# Patient Record
Sex: Male | Born: 1965 | Race: Black or African American | Hispanic: No | Marital: Single
Health system: Southern US, Community
[De-identification: ages and names within clinical notes are randomized; demographics above are authoritative.]

## PROBLEM LIST (undated history)

## (undated) DIAGNOSIS — K5900 Constipation, unspecified: Secondary | ICD-10-CM

## (undated) DIAGNOSIS — E079 Disorder of thyroid, unspecified: Secondary | ICD-10-CM

## (undated) DIAGNOSIS — F29 Unspecified psychosis not due to a substance or known physiological condition: Secondary | ICD-10-CM

## (undated) DIAGNOSIS — F329 Major depressive disorder, single episode, unspecified: Secondary | ICD-10-CM

## (undated) DIAGNOSIS — F809 Developmental disorder of speech and language, unspecified: Secondary | ICD-10-CM

## (undated) DIAGNOSIS — F209 Schizophrenia, unspecified: Secondary | ICD-10-CM

## (undated) DIAGNOSIS — G47419 Narcolepsy without cataplexy: Secondary | ICD-10-CM

## (undated) DIAGNOSIS — F79 Unspecified intellectual disabilities: Secondary | ICD-10-CM

---

## 2005-12-05 ENCOUNTER — Inpatient Hospital Stay (HOSPITAL_COMMUNITY): Admission: AC | Admit: 2005-12-05 | Discharge: 2005-12-20 | Payer: Self-pay
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
## 2007-07-26 ENCOUNTER — Emergency Department (HOSPITAL_COMMUNITY): Admission: EM | Admit: 2007-07-26 | Discharge: 2007-07-26 | Payer: Self-pay | Admitting: *Deleted

## 2007-12-14 ENCOUNTER — Encounter: Admission: RE | Admit: 2007-12-14 | Discharge: 2007-12-14 | Payer: Self-pay | Admitting: Family Medicine

## 2008-06-10 ENCOUNTER — Emergency Department (HOSPITAL_COMMUNITY): Admission: EM | Admit: 2008-06-10 | Discharge: 2008-06-10 | Payer: Self-pay | Admitting: Emergency Medicine

## 2008-06-25 ENCOUNTER — Emergency Department (HOSPITAL_COMMUNITY): Admission: EM | Admit: 2008-06-25 | Discharge: 2008-06-25 | Payer: Self-pay | Admitting: Emergency Medicine

## 2008-12-19 ENCOUNTER — Inpatient Hospital Stay (HOSPITAL_COMMUNITY): Admission: RE | Admit: 2008-12-19 | Discharge: 2008-12-25 | Payer: Self-pay | Admitting: Surgery

## 2009-01-24 ENCOUNTER — Emergency Department (HOSPITAL_COMMUNITY): Admission: EM | Admit: 2009-01-24 | Discharge: 2009-01-24 | Payer: Self-pay | Admitting: Emergency Medicine

## 2010-12-13 ENCOUNTER — Encounter: Payer: Self-pay | Admitting: Family Medicine

## 2011-03-04 LAB — CBC
Hemoglobin: 13.3 g/dL (ref 13.0–17.0)
MCHC: 33.8 g/dL (ref 30.0–36.0)
MCV: 87.3 fL (ref 78.0–100.0)
Platelets: 277 10*3/uL (ref 150–400)

## 2011-03-04 LAB — COMPREHENSIVE METABOLIC PANEL
ALT: 36 U/L (ref 0–53)
AST: 23 U/L (ref 0–37)
BUN: 8 mg/dL (ref 6–23)
Glucose, Bld: 87 mg/dL (ref 70–99)
Potassium: 4.1 mEq/L (ref 3.5–5.1)
Sodium: 139 mEq/L (ref 135–145)
Total Bilirubin: 0.6 mg/dL (ref 0.3–1.2)
Total Protein: 6.9 g/dL (ref 6.0–8.3)

## 2011-03-04 LAB — DIFFERENTIAL
Basophils Relative: 1 % (ref 0–1)
Eosinophils Absolute: 0.6 10*3/uL (ref 0.0–0.7)
Lymphocytes Relative: 23 % (ref 12–46)
Monocytes Absolute: 1 10*3/uL (ref 0.1–1.0)
Monocytes Relative: 9 % (ref 3–12)
Neutrophils Relative %: 63 % (ref 43–77)

## 2011-03-08 LAB — CBC
HCT: 41.5 % (ref 39.0–52.0)
Hemoglobin: 13.7 g/dL (ref 13.0–17.0)
Hemoglobin: 15.6 g/dL (ref 13.0–17.0)
MCHC: 33.3 g/dL (ref 30.0–36.0)
MCV: 89.6 fL (ref 78.0–100.0)
MCV: 89.9 fL (ref 78.0–100.0)
Platelets: 203 10*3/uL (ref 150–400)
Platelets: 233 10*3/uL (ref 150–400)
RDW: 13.7 % (ref 11.5–15.5)

## 2011-03-08 LAB — GLUCOSE, CAPILLARY

## 2011-03-08 LAB — URINALYSIS, ROUTINE W REFLEX MICROSCOPIC
Nitrite: NEGATIVE
Specific Gravity, Urine: 1.026 (ref 1.005–1.030)
Urobilinogen, UA: 1 mg/dL (ref 0.0–1.0)
pH: 6.5 (ref 5.0–8.0)

## 2011-03-08 LAB — BASIC METABOLIC PANEL
BUN: 8 mg/dL (ref 6–23)
CO2: 29 mEq/L (ref 19–32)
Calcium: 9.9 mg/dL (ref 8.4–10.5)
Creatinine, Ser: 1.06 mg/dL (ref 0.4–1.5)
Creatinine, Ser: 1.21 mg/dL (ref 0.4–1.5)
GFR calc Af Amer: >60 mL/min (ref >60)
GFR calc Af Amer: >60 mL/min (ref >60)
GFR calc non Af Amer: >60 mL/min (ref >60)
GFR calc non Af Amer: >60 mL/min (ref >60)
Sodium: 133 mEq/L — ABNORMAL LOW (ref 135–145)

## 2011-03-08 LAB — PROTIME-INR
INR: 1 (ref 0.00–1.49)
Prothrombin Time: 13.3 seconds (ref 11.6–15.2)

## 2011-03-08 LAB — DIFFERENTIAL
Eosinophils Absolute: 0.3 10*3/uL (ref 0.0–0.7)
Eosinophils Relative: 3 % (ref 0–5)
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
## 2011-09-03 LAB — CBC
MCHC: 34.5
RDW: 14.5 — ABNORMAL HIGH
WBC: 11 — ABNORMAL HIGH

## 2011-09-03 LAB — D-DIMER, QUANTITATIVE: D-Dimer, Quant: 0.34

## 2011-09-03 LAB — DIFFERENTIAL
Basophils Absolute: 0.1
Basophils Relative: 1
Eosinophils Absolute: 0.7
Lymphocytes Relative: 31
Lymphs Abs: 3.4 — ABNORMAL HIGH
Monocytes Relative: 10
Neutro Abs: 5.8
Neutrophils Relative %: 52

## 2011-09-03 LAB — I-STAT 8, (EC8 V) (CONVERTED LAB)
Chloride: 106
Glucose, Bld: 90
Potassium: 3.8
TCO2: 28
pH, Ven: 7.369 — ABNORMAL HIGH

## 2011-09-03 LAB — TRICYCLICS SCREEN, URINE: TCA Scrn: NOT DETECTED

## 2011-09-03 LAB — POCT CARDIAC MARKERS
CKMB, poc: <1 — ABNORMAL LOW
Myoglobin, poc: 84.9

## 2011-09-03 LAB — BASIC METABOLIC PANEL
BUN: 11
Calcium: 9
Creatinine, Ser: 1.14
Sodium: 136

## 2011-09-03 LAB — RAPID URINE DRUG SCREEN, HOSP PERFORMED: Tetrahydrocannabinol: NOT DETECTED

## 2015-06-01 ENCOUNTER — Emergency Department (HOSPITAL_COMMUNITY)
Admission: EM | Admit: 2015-06-01 | Discharge: 2015-06-02 | Disposition: A | Discharge status: Home / Self Care | Payer: Medicare Other | Attending: Emergency Medicine | Admitting: Emergency Medicine

## 2015-06-01 DIAGNOSIS — X58XXXA Exposure to other specified factors, initial encounter: Secondary | ICD-10-CM | POA: Insufficient documentation

## 2015-06-01 DIAGNOSIS — T450X1A Poisoning by antiallergic and antiemetic drugs, accidental (unintentional), initial encounter: Secondary | ICD-10-CM | POA: Diagnosis not present

## 2015-06-01 DIAGNOSIS — Z7952 Long term (current) use of systemic steroids: Secondary | ICD-10-CM | POA: Diagnosis not present

## 2015-06-01 DIAGNOSIS — Y9289 Other specified places as the place of occurrence of the external cause: Secondary | ICD-10-CM | POA: Insufficient documentation

## 2015-06-01 DIAGNOSIS — Y998 Other external cause status: Secondary | ICD-10-CM | POA: Diagnosis not present

## 2015-06-01 DIAGNOSIS — Z79899 Other long term (current) drug therapy: Secondary | ICD-10-CM | POA: Insufficient documentation

## 2015-06-01 DIAGNOSIS — T43591A Poisoning by other antipsychotics and neuroleptics, accidental (unintentional), initial encounter: Secondary | ICD-10-CM | POA: Insufficient documentation

## 2015-06-01 DIAGNOSIS — R5383 Other fatigue: Secondary | ICD-10-CM | POA: Diagnosis present

## 2015-06-01 DIAGNOSIS — T50991A Poisoning by other drugs, medicaments and biological substances, accidental (unintentional), initial encounter: Secondary | ICD-10-CM | POA: Insufficient documentation

## 2015-06-01 DIAGNOSIS — T50901A Poisoning by unspecified drugs, medicaments and biological substances, accidental (unintentional), initial encounter: Secondary | ICD-10-CM

## 2015-06-01 DIAGNOSIS — Y9389 Activity, other specified: Secondary | ICD-10-CM | POA: Diagnosis not present

## 2015-06-01 NOTE — ED Notes (Addendum)
According to EMS, Pt was administered incorrect medications at 19:30 on 7/10 (1000mg  Fish Oil, 500mg  Colzaril, 180mg  Alegra). [facility] notified Poison Control and were initially advised to primarily monitor pt and notify EMS if pt became excessively lethargic or symptomatic. [facility] alert and oriented to place.

## 2015-06-02 ENCOUNTER — Encounter (HOSPITAL_COMMUNITY): Payer: Self-pay | Admitting: Emergency Medicine

## 2015-06-02 NOTE — ED Notes (Signed)
Patient ambulated without any assistance.

## 2015-06-02 NOTE — Discharge Instructions (Signed)
DO NOT GIVE ANY SEDATING MEDICATIONS UNTIL THE PATIENT'S SOMNOLENCE IS COMPLETELY RESOLVED.

## 2015-06-02 NOTE — ED Provider Notes (Signed)
CSN: [id number]     Arrival date & time 06/01/15  2354 History   First MD Initiated Contact with Patient 06/02/15 0029     Chief Complaint  Patient presents with  . Drug Overdose     (Consider location/radiation/quality/duration/timing/severity/associated sxs/prior Treatment) Patient is a 49 y.o. male presenting with Overdose. The history is provided by the patient, the nursing home and the EMS personnel. No language interpreter was used.  Drug Overdose This is a new problem. The current episode started today. Associated symptoms include fatigue. Pertinent negatives include no chills or fever. Associated symptoms comments: The patient is a resident at [facility] and was given the wrong medications earlier tonight. Per EMS report, around 7:30 pm he was given Fish Oil, 500 mg Clozaril and 180 mg Alegra. He was monitored for a short period but developed symptoms of "grogginess" and lethargy and so was transported to the emergency department for further management. He has no complaint of pain, nausea, LOC.   Marland Kitchen    History reviewed. No pertinent past medical history. No past surgical history on file. History reviewed. No pertinent family history. History  Substance Use Topics  . Smoking status: Not on file  . Smokeless tobacco: Not on file  . Alcohol Use: Not on file    Review of Systems  Constitutional: Positive for fatigue. Negative for fever and chills.  HENT: Negative.   Respiratory: Negative.   Cardiovascular: Negative.   Gastrointestinal: Negative.   Musculoskeletal: Negative.   Skin: Negative.   Neurological: Negative.       Allergies  Review of patient's allergies indicates no known allergies.  Home Medications   Prior to Admission medications   Medication Sig Start Date End Date Taking? Authorizing Provider  benztropine (COGENTIN) 0.5 MG tablet Take 0.5 mg by mouth daily.   Yes Historical Provider, MD  chlorproMAZINE (THORAZINE) 100 MG tablet Take  100 mg by mouth at bedtime.   Yes Historical Provider, MD  clonazePAM (KLONOPIN) 0.5 MG tablet Take 0.5 mg by mouth at bedtime.   Yes Historical Provider, MD  clonazePAM (KLONOPIN) 1 MG tablet Take 0.5 mg by mouth daily as needed for anxiety.   Yes Historical Provider, MD  docusate sodium (COLACE) 100 MG capsule Take 100 mg by mouth 2 (two) times daily.   Yes Historical Provider, MD  fluPHENAZine (PROLIXIN) 10 MG tablet Take 10 mg by mouth 3 (three) times daily.   Yes Historical Provider, MD  fluticasone (FLONASE) 50 MCG/ACT nasal spray Place 1 spray into both nostrils daily.   Yes Historical Provider, MD  gemfibrozil (LOPID) 600 MG tablet Take 600 mg by mouth daily.   Yes Historical Provider, MD  hydrocortisone 2.5 % cream Apply 1 application topically 2 (two) times daily.   Yes Historical Provider, MD  levothyroxine (SYNTHROID, LEVOTHROID) 25 MCG tablet Take 12.5 mcg by mouth daily before breakfast.   Yes Historical Provider, MD  lurasidone (LATUDA) 80 MG TABS tablet Take 80 mg by mouth daily with breakfast.   Yes Historical Provider, MD  metoprolol tartrate (LOPRESSOR) 25 MG tablet Take 25 mg by mouth 2 (two) times daily.   Yes Historical Provider, MD  Multiple Vitamin (MULTIVITAMIN WITH MINERALS) TABS tablet Take 1 tablet by mouth daily.   Yes Historical Provider, MD  omeprazole (PRILOSEC) 20 MG capsule Take 20 mg by mouth daily.   Yes Historical Provider, MD  sertraline (ZOLOFT) 50 MG tablet Take 50 mg by mouth daily.   Yes Historical Provider, MD  simvastatin (ZOCOR) 5 MG tablet Take 5 mg by mouth at bedtime.   Yes Historical Provider, MD  tamsulosin (FLOMAX) 0.4 MG CAPS capsule Take 0.4 mg by mouth 2 (two) times daily.   Yes Historical Provider, MD  traMADol (ULTRAM) 50 MG tablet Take 50 mg by mouth 2 (two) times daily.   Yes Historical Provider, MD   BP 139/80 mmHg  Pulse 97  Temp(Src) 98 F (36.7 C) (Oral)  Resp 17  SpO2 96% Physical Exam  Constitutional: He is oriented to person,  place, and time. He appears well-developed and well-nourished.  He is awake, mildly slurred speech, appears tired.   HENT:  Head: Normocephalic.  Neck: Normal range of motion. Neck supple.  Cardiovascular: Normal rate and regular rhythm.   Pulmonary/Chest: Effort normal and breath sounds normal.  Abdominal: Soft. Bowel sounds are normal. There is no tenderness. There is no rebound and no guarding.  Musculoskeletal: Normal range of motion.  Neurological: He is alert and oriented to person, place, and time.  Skin: Skin is warm and dry. No rash noted.  Psychiatric: He has a normal mood and affect.    ED Course  Procedures (including critical care time) Labs Review Labs Reviewed - No data to display  Imaging Review No results found.   EKG Interpretation None      MDM   Final diagnoses:  None    1. Accidental overdose  The patient's medication MAR reviewed. He is not prescribed Clozaril at all. He has been sleepy here but easily waken and remains oriented.   The patient is observed for several hours without deterioration in level of consciousness. He has been able to sleep without difficulty. He has eaten and has been drinking. He is ambulatory and steady. He is stable for discharge back to his resident facility.    Elpidio AnisShari Addley Ballinger, PA-C 06/02/15 [id number]  Derwood KaplanAnkit Nanavati, MD 06/02/15 [phone]

## 2017-05-11 ENCOUNTER — Encounter (HOSPITAL_COMMUNITY): Payer: Self-pay

## 2017-05-11 ENCOUNTER — Emergency Department (HOSPITAL_COMMUNITY): Payer: Medicare Other

## 2017-05-11 ENCOUNTER — Emergency Department (HOSPITAL_COMMUNITY)
Admission: EM | Admit: 2017-05-11 | Discharge: 2017-05-12 | Disposition: A | Discharge status: Home / Self Care | Payer: Medicare Other | Attending: Emergency Medicine | Admitting: Emergency Medicine

## 2017-05-11 DIAGNOSIS — F339 Major depressive disorder, recurrent, unspecified: Secondary | ICD-10-CM | POA: Insufficient documentation

## 2017-05-11 DIAGNOSIS — R4182 Altered mental status, unspecified: Secondary | ICD-10-CM | POA: Diagnosis present

## 2017-05-11 DIAGNOSIS — F209 Schizophrenia, unspecified: Secondary | ICD-10-CM | POA: Diagnosis not present

## 2017-05-11 DIAGNOSIS — F918 Other conduct disorders: Secondary | ICD-10-CM | POA: Insufficient documentation

## 2017-05-11 DIAGNOSIS — F200 Paranoid schizophrenia: Secondary | ICD-10-CM | POA: Diagnosis present

## 2017-05-11 DIAGNOSIS — Z79899 Other long term (current) drug therapy: Secondary | ICD-10-CM | POA: Insufficient documentation

## 2017-05-11 DIAGNOSIS — F201 Disorganized schizophrenia: Secondary | ICD-10-CM | POA: Diagnosis not present

## 2017-05-11 DIAGNOSIS — R4689 Other symptoms and signs involving appearance and behavior: Secondary | ICD-10-CM

## 2017-05-11 HISTORY — DX: Unspecified psychosis not due to a substance or known physiological condition: F29

## 2017-05-11 HISTORY — DX: Major depressive disorder, single episode, unspecified: F32.9

## 2017-05-11 HISTORY — DX: Narcolepsy without cataplexy: G47.419

## 2017-05-11 HISTORY — DX: Schizophrenia, unspecified: F20.9

## 2017-05-11 HISTORY — DX: Developmental disorder of speech and language, unspecified: F80.9

## 2017-05-11 HISTORY — DX: Disorder of thyroid, unspecified: E07.9

## 2017-05-11 HISTORY — DX: Constipation, unspecified: K59.00

## 2017-05-11 HISTORY — DX: Unspecified intellectual disabilities: F79

## 2017-05-11 LAB — CBC WITH DIFFERENTIAL/PLATELET
BASOS PCT: 0 %
Basophils Absolute: 0 10*3/uL (ref 0.0–0.1)
Eosinophils Absolute: 0.2 10*3/uL (ref 0.0–0.7)
Eosinophils Relative: 2 %
HCT: 40.9 % (ref 39.0–52.0)
Hemoglobin: 13.8 g/dL (ref 13.0–17.0)
Lymphocytes Relative: 27 %
Lymphs Abs: 3 10*3/uL (ref 0.7–4.0)
MCH: 29.9 pg (ref 26.0–34.0)
MCHC: 33.7 g/dL (ref 30.0–36.0)
MCV: 88.7 fL (ref 78.0–100.0)
MONO ABS: 0.7 10*3/uL (ref 0.1–1.0)
MONOS PCT: 7 %
NEUTROS ABS: 7.2 10*3/uL (ref 1.7–7.7)
Neutrophils Relative %: 64 %
Platelets: 307 10*3/uL (ref 150–400)
RBC: 4.61 MIL/uL (ref 4.22–5.81)
RDW: 14.6 % (ref 11.5–15.5)
WBC: 11.1 10*3/uL — ABNORMAL HIGH (ref 4.0–10.5)

## 2017-05-11 LAB — RAPID URINE DRUG SCREEN, HOSP PERFORMED
Amphetamines: NOT DETECTED
BARBITURATES: NOT DETECTED
Benzodiazepines: NOT DETECTED
Cocaine: NOT DETECTED
OPIATES: NOT DETECTED
TETRAHYDROCANNABINOL: NOT DETECTED

## 2017-05-11 LAB — COMPREHENSIVE METABOLIC PANEL
ALT: 28 U/L (ref 17–63)
ANION GAP: 13 (ref 5–15)
AST: 49 U/L — ABNORMAL HIGH (ref 15–41)
Albumin: 4.3 g/dL (ref 3.5–5.0)
Alkaline Phosphatase: 74 U/L (ref 38–126)
BUN: 18 mg/dL (ref 6–20)
CHLORIDE: 106 mmol/L (ref 101–111)
CO2: 23 mmol/L (ref 22–32)
Calcium: 9.7 mg/dL (ref 8.9–10.3)
Creatinine, Ser: 1.31 mg/dL — ABNORMAL HIGH (ref 0.61–1.24)
GFR calc non Af Amer: >60 mL/min (ref >60)
Glucose, Bld: 80 mg/dL (ref 65–99)
POTASSIUM: 3.8 mmol/L (ref 3.5–5.1)
SODIUM: 142 mmol/L (ref 135–145)
Total Bilirubin: 1 mg/dL (ref 0.3–1.2)
Total Protein: 8.1 g/dL (ref 6.5–8.1)

## 2017-05-11 LAB — ETHANOL: Alcohol, Ethyl (B): <5 mg/dL (ref <5)

## 2017-05-11 LAB — ACETAMINOPHEN LEVEL

## 2017-05-11 LAB — SALICYLATE LEVEL

## 2017-05-11 MED ORDER — ADULT MULTIVITAMIN W/MINERALS CH: 1.0000 | ORAL_TABLET | Freq: Every day | ORAL | Status: DC

## 2017-05-11 MED ORDER — FLUPHENAZINE HCL 5 MG PO TABS
10.0000 mg | ORAL_TABLET | Freq: Three times a day (TID) | ORAL | Status: DC
Start: 2017-05-11 — End: 2017-05-12
  Filled 2017-05-11 (×4): qty 2

## 2017-05-11 MED ORDER — PANTOPRAZOLE SODIUM 40 MG PO TBEC: 40.0000 mg | DELAYED_RELEASE_TABLET | Freq: Every day | ORAL | Status: DC

## 2017-05-11 MED ORDER — VENLAFAXINE HCL ER 75 MG PO CP24: 150.0000 mg | ORAL_CAPSULE | Freq: Every day | ORAL | Status: DC

## 2017-05-11 MED ORDER — LEVOTHYROXINE SODIUM 25 MCG PO TABS
12.5000 ug | ORAL_TABLET | Freq: Every day | ORAL | Status: DC
  Filled 2017-05-11: qty 0.5

## 2017-05-11 MED ORDER — ACETAMINOPHEN 500 MG PO TABS: 500.0000 mg | ORAL_TABLET | Freq: Three times a day (TID) | ORAL | Status: DC | PRN

## 2017-05-11 MED ORDER — BENZTROPINE MESYLATE 0.5 MG PO TABS
0.5000 mg | ORAL_TABLET | Freq: Every day | ORAL | Status: DC
Start: 2017-05-11 — End: 2017-05-12

## 2017-05-11 MED ORDER — MELOXICAM 7.5 MG PO TABS
7.5000 mg | ORAL_TABLET | Freq: Every day | ORAL | Status: DC
  Filled 2017-05-11 (×2): qty 1

## 2017-05-11 MED ORDER — CLONAZEPAM 0.5 MG PO TABS
0.5000 mg | ORAL_TABLET | Freq: Every day | ORAL | Status: DC
  Administered 2017-05-11: 0.5 mg via ORAL
  Filled 2017-05-11: qty 1

## 2017-05-11 MED ORDER — CHLORPROMAZINE HCL 25 MG PO TABS
100.0000 mg | ORAL_TABLET | Freq: Every day | ORAL | Status: DC
  Administered 2017-05-11: 100 mg via ORAL
  Filled 2017-05-11: qty 4

## 2017-05-11 MED ORDER — TRAMADOL HCL 50 MG PO TABS
50.0000 mg | ORAL_TABLET | Freq: Two times a day (BID) | ORAL | Status: DC
  Filled 2017-05-11: qty 1

## 2017-05-11 MED ORDER — GEMFIBROZIL 600 MG PO TABS
600.0000 mg | ORAL_TABLET | Freq: Every day | ORAL | Status: DC
  Filled 2017-05-11: qty 1

## 2017-05-11 MED ORDER — DOCUSATE SODIUM 100 MG PO CAPS
100.0000 mg | ORAL_CAPSULE | Freq: Two times a day (BID) | ORAL | Status: DC
  Administered 2017-05-11: 100 mg via ORAL
  Filled 2017-05-11: qty 1

## 2017-05-11 MED ORDER — TAMSULOSIN HCL 0.4 MG PO CAPS
0.4000 mg | ORAL_CAPSULE | Freq: Two times a day (BID) | ORAL | Status: DC
  Administered 2017-05-11: 0.4 mg via ORAL
  Filled 2017-05-11: qty 1

## 2017-05-11 MED ORDER — FLUTICASONE PROPIONATE 50 MCG/ACT NA SUSP
1.0000 | Freq: Every day | NASAL | Status: DC
  Filled 2017-05-11: qty 16

## 2017-05-11 MED ORDER — METOPROLOL TARTRATE 25 MG PO TABS
25.0000 mg | ORAL_TABLET | Freq: Two times a day (BID) | ORAL | Status: DC
  Administered 2017-05-11: 25 mg via ORAL
  Filled 2017-05-11: qty 1

## 2017-05-11 NOTE — BH Assessment (Signed)
Tele Assessment Note   [name] is an 51 y.o. male.  -Clinician discussed patient with Russell Sites, PA.  Patient is a 51 year old male with history of intellectual disability, schizophrenia, thyroid disease, narcolepsy, psychosis, depressive disorder, presenting [facility] the ED from [facility] for disruptive behavior. Staff members at facility states patient is usually talkative and very friendly, but today he was yelling at them, seemed somewhat agitated, and then stop talking at all. On arrival here, patient will follow all commands when prompted and will shake his head yes and no but will not provide any verbal responses. He did recently have some medication adjustments, his Latuda was reduced from 120 mg to 80 mg recently but unsure why this occurred.  Clinician attempted to engage patient but patient only looked at clinician and did not speak.  Patient gave fair eye contact but did not sit up to try to reciprocate engagement.  Clinician called [name] at [phone].  Spoke to a [employer] who works 2nd shift there and provided information to complete assessment.  [facility] is a 18 bed assisted living center.  She said that patient has not been acting like himself for the last few days.  She said his appetite has been poor.  He has not been talking much lately and he today had the outburst of yelling.  [name] said that he has been living at [facility] since 2010 she thinks.  She said that he does have med management from [name] with [facility].  Latuda was decreased from 120mg  to 80mg  on June 5.  Patient has been refusing meds for the last few days also.  Patient was at [facility] many years ago.  Outpatient services through [facility] currently.  Patient does use tobacco.  [name] said that the best person to talk to tomorrow would be [name] at their facility.  He is in at 07:00 and can be contacted at [phone]  [phone].  Diagnosis: I/DD severe; MDD, schizophrenia  Past Medical History:  Past Medical History:  Diagnosis Date  . Constipation   . Intellectual disability   . Major depressive disorder   . Mental and behavioral problems with communication (including speech)   . Narcolepsy   . Psychosis   . Schizophrenia (HCC)   . Thyroid disease     No past surgical history on file.  Family History: No family history on file.  Social History:  has no tobacco, alcohol, and drug history on file.  Additional Social History:  Alcohol / Drug Use Pain Medications: Tramadol 50mg  twice times daily Prescriptions: Cogentin & Thorazine too Over the Counter: Tylenol History of alcohol / drug use?: No history of alcohol / drug abuse  CIWA: CIWA-Ar BP: (!) 127/55 Pulse Rate: 95 COWS:    PATIENT STRENGTHS: (choose at least two) Communication skills Physical Health  Allergies: No Known Allergies  Home Medications:  (Not in a hospital admission)  OB/GYN Status:  No LMP for male patient.  General Assessment Data [facility] of Assessment: [facility] TTS Assessment: In system Is this a Tele or Face-to-Face Assessment?: Face-to-Face Is this an Initial Assessment or a Re-assessment for this encounter?: Initial Assessment Marital status: Single Is patient pregnant?: No Pregnancy Status: No Living Arrangements: [name] (18 bed facility) assisted li) Can pt return to current living arrangement?: Yes Admission Status: Voluntary Is patient capable of signing voluntary admission?: No (Unsure.  Pt has severe  I/DD) Referral Source: Other ([facility] referred by [name] Insurance type: MCR/MCD     Crisis Care Plan Living Arrangements: Group [name] (18 bed facility) assisted li) Legal Guardian: Other: (Unsure at this time.) Name of Psychiatrist: Dr. Estrella [facility] with [name] Name of Therapist: [facility]?  Education  Status Is patient currently in school?: No  Risk to self with the past 6 months Suicidal Ideation: No Has patient been a risk to self within the past 6 months prior to admission? : No Suicidal Intent: No Has patient had any suicidal intent within the past 6 months prior to admission? : No Is patient at risk for suicide?: No Suicidal Plan?: No Has patient had any suicidal plan within the past 6 months prior to admission? : No Access to Means: No What has been your use of drugs/alcohol within the last 12 months?: N/A Previous Attempts/Gestures: No How many times?: 0 Other Self Harm Risks: None Triggers for Past Attempts: None known Intentional Self Injurious Behavior: None Family Suicide History: No Recent stressful life event(s): Recent negative physical changes (Medication changes?) Persecutory voices/beliefs?: No Depression: Yes Depression Symptoms: Despondent (Difficult to assess) Substance abuse history and/or treatment for substance abuse?: No Suicide prevention information given to non-admitted patients: Not applicable  Risk to Others within the past 6 months Homicidal Ideation: No Does patient have any lifetime risk of violence toward others beyond the six months prior to admission? : No Thoughts of Harm to Others: No Current Homicidal Intent: No Current Homicidal Plan: No Access to Homicidal Means: No Identified Victim: No one History of harm to others?: No Assessment of Violence: None Noted Violent Behavior Description: No history Does patient have access to weapons?: No Criminal Charges Pending?: No Does patient have a court date: No Is patient on probation?: No  Psychosis Hallucinations: Auditory (Pt has been whispering a lot to himself lately) Delusions: None noted  Mental Status Report Appearance/Hygiene: Unremarkable Eye Contact: Fair Motor Activity: Freedom of movement, Unremarkable Speech: Unable to assess (Pt would not speak.) Level of Consciousness:  Alert Mood: Anxious, Suspicious, Helpless Affect: Apprehensive, Fearful Anxiety Level: Severe Thought Processes: Unable to Assess Judgement: Impaired Orientation: Appropriate for developmental age Obsessive Compulsive Thoughts/Behaviors: Unable to Assess  Cognitive Functioning Concentration: Poor Memory: Recent Impaired, Remote Impaired IQ: Below Average Level of Function: Severe I/DD Insight: Poor Impulse Control: Poor Appetite: Poor Weight Loss:  (Not eating well over last few days.) Weight Gain: 0 Sleep: Unable to Assess Total Hours of Sleep:  (Unsure) Vegetative Symptoms: Staying in bed  ADLScreening [facility] Assessment Services) Patient's cognitive ability adequate to safely complete daily activities?: Yes Patient able to express need for assistance with ADLs?: Yes Independently performs ADLs?: No  Prior Inpatient Therapy Prior Inpatient Therapy: Yes Prior Therapy Dates: Unknown Prior Therapy Facilty/Provider(s): [facility] Reason for Treatment: Unknown  Prior Outpatient Therapy Prior Outpatient Therapy: Yes Prior Therapy Dates: Current Prior Therapy Facilty/Provider(s): [facility] Reason for Treatment: med management & therapy Does patient have an ACCT team?: No Does patient have Intensive In-House Services?  : No Does patient have [facility] services? : No Does patient have P4CC services?: No  ADL Screening (condition at time of admission) Patient's cognitive ability adequate to safely complete daily activities?: Yes Is the patient deaf or have difficulty hearing?: No Does the patient have difficulty seeing, even when wearing glasses/contacts?: Yes (Wears glasses.) Does the patient have difficulty concentrating, remembering, or making decisions?: Yes Patient able to express need for assistance with ADLs?:  Yes Does the patient have difficulty dressing or bathing?: Yes Independently performs ADLs?: No Communication: Needs assistance Is this a change from baseline?:  Change from baseline, expected to last >3 days Dressing (OT): Needs assistance Is this a change from baseline?: Pre-admission baseline (May need some reminders.) Grooming: Needs assistance Is this a change from baseline?: Pre-admission baseline Feeding: Independent Bathing: Needs assistance Is this a change from baseline?: Pre-admission baseline (Usually independent) Toileting: Independent In/Out Bed: Independent Walks in Home: Independent Does the patient have difficulty walking or climbing stairs?: No Weakness of Legs: None Weakness of Arms/Hands: None       Abuse/Neglect Assessment (Assessment to be complete while patient is alone) Physical Abuse: Denies Verbal Abuse: Denies Sexual Abuse: Denies Exploitation of patient/patient's resources: Denies Self-Neglect: Denies     Merchant navy officerAdvance Directives (For Healthcare) Does Patient Have a Medical Advance Directive?: No Would patient like information on creating a medical advance directive?: No - Patient declined    Additional Information 1:1 In Past 12 Months?: No CIRT Risk: No Elopement Risk: No Does patient have medical clearance?: Yes     Disposition:  Disposition Initial Assessment Completed for this Encounter: Yes Disposition of Patient: Other dispositions Other disposition(s): Other (Comment) (Per Russell SievertSpencer Simon, PA pt needs AM psych eval.)  [name], [name] 05/11/2017 10:18 PM

## 2017-05-11 NOTE — ED Provider Notes (Signed)
[facility]-EMERGENCY DEPT Provider Note   CSN: [id number] Arrival date & time: 05/11/17  1545     History   Chief Complaint Chief Complaint  Patient presents with  . Altered Mental Status    HPI [name] is a 51 y.o. male.  The history is provided by the patient and medical records.  Altered Mental Status      51 year old male With history of intellectual disability, schizophrenia, thyroid disease, narcolepsy, psychosis, depressive disorder, presenting to the ED from [facility] for disruptive behavior. Staff members at facility states patient is usually talkative and very friendly, but today he was yelling at them, seemed somewhat agitated, and then stop talking at all. On arrival here, patient will follow all commands when prompted and will shake his head yes and no but will not provide any verbal responses. He did recently have some medication adjustments, his Latuda was reduced from 120 mg to 80 mg recently but unsure why this occurred.  No past medical history on file.  There are no active problems to display for this patient.   No past surgical history on file.     Home Medications    Prior to Admission medications   Medication Sig Start Date End Date Taking? Authorizing Provider  benztropine (COGENTIN) 0.5 MG tablet Take 0.5 mg by mouth daily.    [provider]  chlorproMAZINE (THORAZINE) 100 MG tablet Take 100 mg by mouth at bedtime.    [provider]  clonazePAM (KLONOPIN) 0.5 MG tablet Take 0.5 mg by mouth at bedtime.    [provider]  clonazePAM (KLONOPIN) 1 MG tablet Take 0.5 mg by mouth daily as needed for anxiety.    [provider]  docusate sodium (COLACE) 100 MG capsule Take 100 mg by mouth 2 (two) times daily.    [provider]  fluPHENAZine (PROLIXIN) 10 MG tablet Take 10 mg by mouth 3 (three) times daily.    [provider]  fluticasone (FLONASE) 50 MCG/ACT nasal spray Place 1  spray into both nostrils daily.    [provider]  gemfibrozil (LOPID) 600 MG tablet Take 600 mg by mouth daily.    [provider]  hydrocortisone 2.5 % cream Apply 1 application topically 2 (two) times daily.    [provider]  levothyroxine (SYNTHROID, LEVOTHROID) 25 MCG tablet Take 12.5 mcg by mouth daily before breakfast.    [provider]  lurasidone (LATUDA) 80 MG TABS tablet Take 80 mg by mouth daily with breakfast.    [provider]  metoprolol tartrate (LOPRESSOR) 25 MG tablet Take 25 mg by mouth 2 (two) times daily.    [provider]  Multiple Vitamin (MULTIVITAMIN WITH MINERALS) TABS tablet Take 1 tablet by mouth daily.    [provider]  omeprazole (PRILOSEC) 20 MG capsule Take 20 mg by mouth daily.    [provider]  sertraline (ZOLOFT) 50 MG tablet Take 50 mg by mouth daily.    [provider]  simvastatin (ZOCOR) 5 MG tablet Take 5 mg by mouth at bedtime.    [provider]  tamsulosin (FLOMAX) 0.4 MG CAPS capsule Take 0.4 mg by mouth 2 (two) times daily.    [provider]  traMADol (ULTRAM) 50 MG tablet Take 50 mg by mouth 2 (two) times daily.    [provider]    Family History No family history on file.  Social History Social History  Substance Use Topics  .  Smoking status: Not on file  . Smokeless tobacco: Not on file  . Alcohol use Not on file     Allergies   Patient has no known allergies.   Review of Systems Review of Systems  Psychiatric/Behavioral: Positive for behavioral problems.  All other systems reviewed and are negative.    Physical Exam Updated Vital Signs BP (!) 147/73   Pulse (!) 105   Temp 98.7 F (37.1 C) (Oral)   Resp 16   SpO2 98%   Physical Exam  Constitutional: He appears well-developed and well-nourished.  HENT:  Head: Normocephalic and atraumatic.  Mouth/Throat: Oropharynx is clear and moist.  Eyes:  Conjunctivae and EOM are normal. Pupils are equal, round, and reactive to light.  Pupils are symmetric and reactive bilaterally  Neck: Normal range of motion.  Cardiovascular: Normal rate, regular rhythm and normal heart sounds.   Pulmonary/Chest: Effort normal and breath sounds normal. No respiratory distress. He has no wheezes.  Abdominal: Soft. Bowel sounds are normal. There is no tenderness. There is no rebound.  Musculoskeletal: Normal range of motion.  Neurological: He is alert.  Awake, alert, seemingly oriented, will shake head yes and no to questions but will not speak, he follows all commands when prompted, has normal strength and sensation throughout his upper and lower extremities without noted ataxia, no tremors or seizure activity  Skin: Skin is warm and dry.  Psychiatric: He has a normal mood and affect.  Nursing note and vitals reviewed.    ED Treatments / Results  Labs (all labs ordered are listed, but only abnormal results are displayed) Labs Reviewed  CBC WITH DIFFERENTIAL/PLATELET - Abnormal; Notable for the following:       Result Value   WBC 11.1 (*)    All other components within normal limits  ACETAMINOPHEN LEVEL - Abnormal; Notable for the following:    Acetaminophen (Tylenol), Serum <10 (*)    All other components within normal limits  COMPREHENSIVE METABOLIC PANEL - Abnormal; Notable for the following:    Creatinine, Ser 1.31 (*)    AST 49 (*)    All other components within normal limits  ETHANOL  RAPID URINE DRUG SCREEN, HOSP PERFORMED  SALICYLATE LEVEL    EKG  EKG Interpretation None       Radiology Ct Head Wo Contrast  Result Date: 05/11/2017 CLINICAL DATA:  Altered mental status EXAM: CT HEAD WITHOUT CONTRAST TECHNIQUE: Contiguous axial images were obtained from the base of the skull through the vertex without intravenous contrast. COMPARISON:  None. FINDINGS: Brain: No mass lesion, intraparenchymal hemorrhage or extra-axial collection. No  evidence of acute cortical infarct. Brain parenchyma and CSF-containing spaces are normal for age. Vascular: No hyperdense vessel or unexpected calcification. Skull: Normal visualized skull base, calvarium and extracranial soft tissues. Sinuses/Orbits: No sinus fluid levels or advanced mucosal thickening. No mastoid effusion. Normal orbits. IMPRESSION: Normal head CT. Electronically Signed   By: Deatra RobinsonKevin  Herman M.D.   On: 05/11/2017 17:03    Procedures Procedures (including critical care time)  Medications Ordered in ED Medications - No data to display   Initial Impression / Assessment and Plan / ED Course  I have reviewed the triage vital signs and the nursing notes.  Pertinent labs & imaging results that were available during my care of the patient were reviewed by me and considered in my medical decision making (see chart for details).  51 year old male here with altered mental status. [facility] Reportedly today he  was yelling and being somewhat hostile with staff and then started refusing to talk. Here patient is still refusing to talk, but he is following commands and answering questions by shaking his head yes or no. He seems to have insight into current situation. He is drinking water without issue. This does not seem to be a true expressive aphasia. CT head obtained and is negative for acute findings. Labwork is overall reassuring. There have been recent medication adjustments to his low to the, unsure why this occurred. This may be contributing to his symptoms. We'll plan for TTS consult.  10:34 PM TTS has evaluated patient and has spoken with staff at his assisted living facility-- recommends morning psych assessment.  Home meds have been ordered for him, however he has been refusing meds thus far.  Final Clinical Impressions(s) / ED Diagnoses   Final diagnoses:  Behavior concern    New Prescriptions New Prescriptions   No medications on file       Garlon Hatchet, PA-C 05/11/17 2240    Shaune Pollack, MD 05/14/17 [phone]

## 2017-05-11 NOTE — ED Notes (Signed)
Pt is refusing to provide urine sample.

## 2017-05-11 NOTE — ED Triage Notes (Signed)
Per EMS- [facility] Staff reported that the patient was taking Latuda 120 mg as of January/2018 and dose was reduced to 80 mg on April 26, 2017. Staff reports that the patient is normally talkative and friendly, today the patient would only yell at them and when not yelling would not talk at all.

## 2017-05-11 NOTE — ED Notes (Signed)
Pt is alert with eye opening response. Pt replied hello, but when asked questions as to pain and needs regarding voiding  Or watching TV pt remained silent.

## 2017-05-11 NOTE — ED Notes (Signed)
Pt will not communicate but will allow you to do whatever you ask of him. Pt will just not respond verbally.

## 2017-05-11 NOTE — ED Notes (Signed)
Bed: [id number] Expected date: 05/11/17 Expected time: 3:53 PM Means of arrival: Ambulance Comments: Med Clearance

## 2017-05-12 ENCOUNTER — Emergency Department (HOSPITAL_COMMUNITY): Payer: Medicare Other

## 2017-05-12 DIAGNOSIS — F201 Disorganized schizophrenia: Secondary | ICD-10-CM

## 2017-05-12 DIAGNOSIS — F200 Paranoid schizophrenia: Secondary | ICD-10-CM | POA: Diagnosis present

## 2017-05-12 DIAGNOSIS — F918 Other conduct disorders: Secondary | ICD-10-CM | POA: Diagnosis not present

## 2017-05-12 LAB — URINALYSIS, ROUTINE W REFLEX MICROSCOPIC
Bilirubin Urine: NEGATIVE
Glucose, UA: NEGATIVE mg/dL
Hgb urine dipstick: NEGATIVE
Ketones, ur: 80 mg/dL — AB
LEUKOCYTES UA: NEGATIVE
NITRITE: NEGATIVE
PH: 5 (ref 5.0–8.0)
Protein, ur: NEGATIVE mg/dL
SPECIFIC GRAVITY, URINE: 1.027 (ref 1.005–1.030)

## 2017-05-12 NOTE — ED Notes (Signed)
Nurse Marchelle FolksAmanda called and reported to Myrene BuddyYvonne at [facility] the patient's discharge disposition, medications, vital signs, and overall status. Questions answered. No further questions at this time.

## 2017-05-12 NOTE — ED Notes (Signed)
Pt refused all oral medications and began to talk in low tone unable to discuss why he was refusing medications.

## 2017-05-12 NOTE — Progress Notes (Addendum)
CSW updated RN who will notify the CSW if facility ([facility]) refuses to find or arrange safe transport for the pt.  Per the RN, pt is unsafe to travel by taxi due to the pt's cognitive abilities/mental illness.  CSW will continue to follow for D/C needs.  Dorothe PeaJonathan F. Tobie Hellen, Theresia MajorsLCSWA, LCAS Clinical Social Worker Ph: [phone]

## 2017-05-12 NOTE — ED Notes (Signed)
[facility] staff picked up patient and received discharge instructions. Patient was transported out via wheelchair.

## 2017-05-12 NOTE — ED Notes (Addendum)
Nurse Marchelle FolksAmanda spoke to personnel at [facility], who stated they did not have transportation to get the patient back to their facility. The Personnel stated she would call her facility administrator to figure out a plan to transport the patient. Public relations account executiveacility administrator to call [facility] back at the provided hospital number.

## 2017-05-12 NOTE — Consult Note (Signed)
[place] [facility] Consult   Reason for Consult:  Change in behavior Referring Physician:  EDP Patient Identification: [name] MRN:  [id number] Principal Diagnosis: Schizophrenia [facility]) Diagnosis:   Patient Active Problem List   Diagnosis Date Noted  . Schizophrenia ([place]) [F20.9] 05/12/2017    Priority: High    Total Time spent with patient: 30 minutes  Subjective:   [name] is a 51 y.o. male patient admitted with reports of changes in behavior when changing medications, including being disruptive. Pt was minimally conversational but did not exhibit signs of active psychosis, disruptive behavior, or other concerning trains which would warrant inpatient admission. Pt may return to facility with current meds below in [facility] from facility as it appears they just changed them from the [place] he was recently on. They should ensure med compliance and evaluate the efficacy of new regimen. Pt would not benefit from inpatient management and can be managed in the comfort of his own facility where he resides at [facility].   HPI:  I have reviewed and concur with HPI elements below, modified as follows:  "[name] is an 51 y.o. male. Clinician discussed patient with Russell Carnes, PA.  Patient is a 51 year old male with history of intellectual disability, schizophrenia, thyroid disease, narcolepsy, psychosis, depressive disorder, presenting to the ED from [facility] for disruptive behavior. Staff members at facility states patient is usually talkative and very friendly, but today he was yelling at them, seemed somewhat agitated, and then stop talking at all. On arrival here, patient will follow all commands when prompted and will shake his head yes and no but will not provide any verbal responses. He did recently have some medication adjustments, his Latuda was reduced from 120 mg to 80 mg recently but unsure why this occurred.  Clinician attempted to engage  patient but patient only looked at clinician and did not speak.  Patient gave fair eye contact but did not sit up to try to reciprocate engagement.  Clinician called [facility] at [phone].  Spoke to a [employer] who works 2nd shift there and provided information to complete assessment.  [facility] is a 18 bed assisted living center.  She said that patient has not been acting like himself for the last few days.  She said his appetite has been poor.  He has not been talking much lately and he today had the outburst of yelling.  [name] said that he has been living at [facility] since 2010 she thinks.  She said that he does have med management from Dr. Lovena Le with [facility].  Latuda was decreased from 159m to 865mon June 5.  Patient has been refusing meds for the last few days also.[facility]  [facility].  Patient does use tobacco. YvKendrick Friesaid that the best person to talk to tomorrow would be Mr. DaValda Barry their facility.  He is in at 07:00 and can be contacted at [phone]."  Pt has been minimally interactive with staff, but cooperative. Pt will return to facility.   Past Psychiatric History: schizophrenia   Risk to Self: Suicidal Ideation: No Suicidal Intent: No Is patient at risk for suicide?: No Suicidal Plan?: No Access to Means: No What has been your use of drugs/alcohol within the last 12 months?: N/A How many times?: 0 Other Self Harm Risks: None Triggers for Past Attempts: None known Intentional Self  Injurious Behavior: None Risk to Others: Homicidal Ideation: No Thoughts of Harm to Others: No Current Homicidal Intent: No Current Homicidal Plan: No Access to Homicidal Means: No Identified Victim: No one History of harm to others?: No Assessment of Violence: None Noted Violent Behavior Description: No history Does patient have access to  weapons?: No Criminal Charges Pending?: No Does patient have a court date: No Prior Inpatient Therapy: Prior Inpatient Therapy: Yes Prior Therapy Dates: Unknown Prior Therapy Facilty/Provider(s): [place] Reason for Treatment: Unknown Prior Outpatient Therapy: Prior Outpatient Therapy: Yes Prior Therapy Dates: Current Prior Therapy Facilty/Provider(s): [facility] Reason for Treatment: med management & therapy Does patient have an ACCT team?: No Does patient have Intensive In-House Services?  : No Does patient have [facility] services? : No Does patient have P4CC services?: No  Past Medical History:  Past Medical History:  Diagnosis Date  . Constipation   . Intellectual disability   . Major depressive disorder   . Mental and behavioral problems with communication (including speech)   . Narcolepsy   . Psychosis   . Schizophrenia ([place])   . Thyroid disease    No past surgical history on file. Family History: No family history on file. Family Psychiatric  History: denies Social History:  History  Alcohol use Not on file     History  Drug use: Unknown    Social History   Social History  . Marital status: Single    Spouse name: N/A  . Number of children: N/A  . Years of education: N/A   Social History Main Topics  . Smoking status: Not on file  . Smokeless tobacco: Not on file  . Alcohol use Not on file  . Drug use: Unknown  . Sexual activity: Not on file   Other Topics Concern  . Not on file   Social History Narrative  . No narrative on file   Additional Social History:    Allergies:  No Known Allergies  Labs:  Results for orders placed or performed during the hospital encounter of 05/11/17 (from the past 48 hour(s))  CBC with Differential     Status: Abnormal   Collection Time: 05/11/17  4:13 PM  Result Value Ref Range   WBC 11.1 (H) 4.0 - 10.5 K/uL   RBC 4.61 4.22 - 5.81 MIL/uL   Hemoglobin 13.8 13.0 - 17.0 g/dL   HCT 40.9 39.0 - 52.0 %   MCV 88.7 78.0 -  100.0 fL   MCH 29.9 26.0 - 34.0 pg   MCHC 33.7 30.0 - 36.0 g/dL   RDW 14.6 11.5 - 15.5 %   Platelets 307 150 - 400 K/uL   Neutrophils Relative % 64 %   Neutro Abs 7.2 1.7 - 7.7 K/uL   Lymphocytes Relative 27 %   Lymphs Abs 3.0 0.7 - 4.0 K/uL   Monocytes Relative 7 %   Monocytes Absolute 0.7 0.1 - 1.0 K/uL   Eosinophils Relative 2 %   Eosinophils Absolute 0.2 0.0 - 0.7 K/uL   Basophils Relative 0 %   Basophils Absolute 0.0 0.0 - 0.1 K/uL  Ethanol     Status: None   Collection Time: 05/11/17  4:13 PM  Result Value Ref Range   Alcohol, Ethyl (B) <5 <5 mg/dL    Comment:        LOWEST DETECTABLE LIMIT FOR SERUM ALCOHOL IS 5 mg/dL FOR MEDICAL PURPOSES ONLY   Salicylate level     Status: None   Collection Time: 05/11/17  4:13  PM  Result Value Ref Range   Salicylate Lvl <3.5 2.8 - 30.0 mg/dL  Acetaminophen level     Status: Abnormal   Collection Time: 05/11/17  4:13 PM  Result Value Ref Range   Acetaminophen (Tylenol), Serum <10 (L) 10 - 30 ug/mL    Comment:        THERAPEUTIC CONCENTRATIONS VARY SIGNIFICANTLY. A RANGE OF 10-30 ug/mL MAY BE AN EFFECTIVE CONCENTRATION FOR MANY PATIENTS. HOWEVER, SOME ARE BEST TREATED AT CONCENTRATIONS OUTSIDE THIS RANGE. ACETAMINOPHEN CONCENTRATIONS >150 ug/mL AT 4 HOURS AFTER INGESTION AND >50 ug/mL AT 12 HOURS AFTER INGESTION ARE OFTEN ASSOCIATED WITH TOXIC REACTIONS.   Comprehensive metabolic panel     Status: Abnormal   Collection Time: 05/11/17  4:13 PM  Result Value Ref Range   Sodium 142 135 - 145 mmol/L   Potassium 3.8 3.5 - 5.1 mmol/L   Chloride 106 101 - 111 mmol/L   CO2 23 22 - 32 mmol/L   Glucose, Bld 80 65 - 99 mg/dL   BUN 18 6 - 20 mg/dL   Creatinine, Ser 1.31 (H) 0.61 - 1.24 mg/dL   Calcium 9.7 8.9 - 10.3 mg/dL   Total Protein 8.1 6.5 - 8.1 g/dL   Albumin 4.3 3.5 - 5.0 g/dL   AST 49 (H) 15 - 41 U/L   ALT 28 17 - 63 U/L   Alkaline Phosphatase 74 38 - 126 U/L   Total Bilirubin 1.0 0.3 - 1.2 mg/dL   GFR calc non Af  Amer >60 >60 mL/min   GFR calc Af Amer >60 >60 mL/min    Comment: (NOTE) The eGFR has been calculated using the CKD EPI equation. This calculation has not been validated in all clinical situations. eGFR's persistently <60 mL/min signify possible Chronic Kidney Disease.    Anion gap 13 5 - 15  Rapid urine drug screen (hospital performed)     Status: None   Collection Time: 05/11/17  7:00 PM  Result Value Ref Range   Opiates NONE DETECTED NONE DETECTED   Cocaine NONE DETECTED NONE DETECTED   Benzodiazepines NONE DETECTED NONE DETECTED   Amphetamines NONE DETECTED NONE DETECTED   Tetrahydrocannabinol NONE DETECTED NONE DETECTED   Barbiturates NONE DETECTED NONE DETECTED    Comment:        DRUG SCREEN FOR MEDICAL PURPOSES ONLY.  IF CONFIRMATION IS NEEDED FOR ANY PURPOSE, NOTIFY LAB WITHIN 5 DAYS.        LOWEST DETECTABLE LIMITS FOR URINE DRUG SCREEN Drug Class       Cutoff (ng/mL) Amphetamine      1000 Barbiturate      200 Benzodiazepine   597 Tricyclics       416 Opiates          300 Cocaine          300 THC              50     Current Facility-Administered Medications  Medication Dose Route Frequency Provider Last Rate Last Dose  . acetaminophen (TYLENOL) tablet 500 mg  500 mg Oral Q8H PRN Larene Pickett, PA-C      . benztropine (COGENTIN) tablet 0.5 mg  0.5 mg Oral Daily [name] M, PA-C      . chlorproMAZINE (THORAZINE) tablet 100 mg  100 mg Oral QHS Larene Pickett, PA-C   100 mg at 05/11/17 2319  . clonazePAM (KLONOPIN) tablet 0.5 mg  0.5 mg Oral QHS Larene Pickett, PA-C   0.5  mg at 05/11/17 2319  . docusate sodium (COLACE) capsule 100 mg  100 mg Oral BID Larene Pickett, PA-C   100 mg at 05/11/17 2321  . fluPHENAZine (PROLIXIN) tablet 10 mg  10 mg Oral TID Larene Pickett, PA-C      . fluticasone [facility]) 50 MCG/ACT nasal spray 1 spray  1 spray Each Nare Daily Larene Pickett, PA-C      . gemfibrozil (LOPID) tablet 600 mg  600 mg Oral Daily [name] M,  PA-C      . levothyroxine (SYNTHROID, LEVOTHROID) tablet 12.5 mcg  12.5 mcg Oral QAC breakfast [name] M, PA-C      . meloxicam [facility]) tablet 7.5 mg  7.5 mg Oral Daily [name] M, PA-C      . metoprolol tartrate (LOPRESSOR) tablet 25 mg  25 mg Oral BID Larene Pickett, PA-C   25 mg at 05/11/17 2323  . multivitamin with minerals tablet 1 tablet  1 tablet Oral Daily [name] M, PA-C      . pantoprazole (PROTONIX) EC tablet 40 mg  40 mg Oral Daily [name] M, PA-C      . tamsulosin [facility]) capsule 0.4 mg  0.4 mg Oral BID Larene Pickett, PA-C   0.4 mg at 05/11/17 2319  . traMADol (ULTRAM) tablet 50 mg  50 mg Oral BID Larene Pickett, PA-C      . venlafaxine XR (EFFEXOR-XR) 24 hr capsule 150 mg  150 mg Oral Q breakfast Larene Pickett, PA-C       Current Outpatient Prescriptions  Medication Sig Dispense Refill  . acetaminophen (TYLENOL) 500 MG tablet Take 500 mg by mouth every 8 (eight) hours as needed for mild pain.    . benztropine (COGENTIN) 0.5 MG tablet Take 0.5 mg by mouth daily.    . chlorproMAZINE (THORAZINE) 100 MG tablet Take 100 mg by mouth at bedtime.    . clonazePAM (KLONOPIN) 0.5 MG tablet Take 0.5 mg by mouth at bedtime.    . docusate sodium (COLACE) 100 MG capsule Take 100 mg by mouth 2 (two) times daily.    . fluPHENAZine (PROLIXIN) 10 MG tablet Take 10 mg by mouth 3 (three) times daily.    . fluticasone (FLONASE) 50 MCG/ACT nasal spray Place 1 spray into both nostrils daily.    Marland Kitchen gemfibrozil (LOPID) 600 MG tablet Take 600 mg by mouth daily.    Marland Kitchen levothyroxine (SYNTHROID, LEVOTHROID) 25 MCG tablet Take 12.5 mcg by mouth daily before breakfast.    . meloxicam (MOBIC) 7.5 MG tablet Take 7.5 mg by mouth daily.    . metoprolol tartrate (LOPRESSOR) 25 MG tablet Take 25 mg by mouth 2 (two) times daily.    . Multiple Vitamin (MULTIVITAMIN WITH MINERALS) TABS tablet Take 1 tablet by mouth daily.    Marland Kitchen omeprazole (PRILOSEC) 20 MG capsule Take 20 mg by mouth daily.     . tamsulosin (FLOMAX) 0.4 MG CAPS capsule Take 0.4 mg by mouth 2 (two) times daily.    . traMADol (ULTRAM) 50 MG tablet Take 50 mg by mouth 2 (two) times daily.    Marland Kitchen venlafaxine XR (EFFEXOR-XR) 150 MG 24 hr capsule Take 150 mg by mouth daily with breakfast.      Musculoskeletal: Strength & Muscle Tone: within normal limits Gait & Station: lying in bed Patient leans: N/A  Psychiatric Specialty Exam: Physical Exam  Review of Systems  Psychiatric/Behavioral: Positive for depression. The patient is nervous/anxious and has  insomnia.   All other systems reviewed and are negative.   Blood pressure (!) 145/86, pulse 86, temperature 98.7 F (37.1 C), temperature source Oral, resp. rate 16, SpO2 100 %.There is no height or weight on file to calculate BMI.  General Appearance: Casual and Fairly Groomed  Eye Contact:  Minimal  Speech:  Clear and Coherent and Normal Rate  Volume:  Decreased  Mood:  Dysphoric  Affect:  Congruent  Thought Process:  Linear and Descriptions of Associations: Intact  Orientation:  Not answering questions, but staff report oriented, pt is somewhat withdrawn  Thought Content:  Focused on discharge   Suicidal Thoughts:  No  Homicidal Thoughts:  No  Memory:  Immediate;   Fair Recent;   Fair Remote;   Fair  Judgement:  Fair  Insight:  Lacking  Psychomotor Activity:  Decreased  Concentration:  Concentration: Poor and Attention Span: Poor  Recall:  [employer] of Knowledge:  Fair  Language:  Fair  Akathisia:  No  Handed:    AIMS (if indicated):     Assets:  Communication Skills Desire for Improvement Resilience Social Support  ADL's:  Intact  Cognition:  Does not appear to be impaired  Sleep:      Treatment Plan Summary: Schizophrenia ([place]) stable for management at living facility with current medication regimen:  Scheduled Meds: . benztropine  0.5 mg Oral Daily  . chlorproMAZINE  100 mg Oral QHS  . clonazePAM  0.5 mg Oral QHS  . docusate sodium  100  mg Oral BID  . fluPHENAZine  10 mg Oral TID  . fluticasone  1 spray Each Nare Daily  . gemfibrozil  600 mg Oral Daily  . levothyroxine  12.5 mcg Oral QAC breakfast  . meloxicam  7.5 mg Oral Daily  . metoprolol tartrate  25 mg Oral BID  . multivitamin with minerals  1 tablet Oral Daily  . pantoprazole  40 mg Oral Daily  . tamsulosin  0.4 mg Oral BID  . traMADol  50 mg Oral BID  . venlafaxine XR  150 mg Oral Q breakfast   Continuous Infusions: PRN Meds:.acetaminophen   Disposition: No evidence of imminent risk to self or others at present.   Patient does not meet criteria for psychiatric inpatient admission. Supportive therapy provided about ongoing stressors. Discussed crisis plan, support from social network, calling 911, coming to the Emergency Department, and calling Suicide Hotline. Discharge back to facility  [name], [facility] 05/12/2017 11:03 AM  Patient seen face-to-face for psychiatric evaluation, chart reviewed and case discussed with the physician extender and developed treatment plan. Reviewed the information documented and agree with the treatment plan. Russell Pilgrim, MD

## 2017-05-12 NOTE — Progress Notes (Signed)
CSW called [facility] again and phone was answered by Kelli ChurnYvonne Rhodes.  Miss Jenean LindauRhodes provided CSW with name and number of Caroline MoreHazel Foreman at ph: [phone] and stated Miss Janey GreaserForeman was the "administrator" for [facility].   CSW spoke to [facility] who is reaching out to a family member to request they come pick the pt up due to [facility] not having available transportation.  Miss foreman asked if the CSW could provided a taxi for the pt and the CSW stated no, [facility] ED or CSW will not provide a taxi.  CSW informed Miss Janey GreaserForeman that if family member does not pick the pt up and [facility] does not pick the pt up then the [facility] is for the CSW to call both DSS's Adult Protective Services, as well as DHHS and report [facility].  Miss Janey GreaserForeman acknowledged that she understood that the procedure [facility] is for the CSW to call both DSS's Adult Protective Services, as well as DHHS and [facility] the [facility].  CSW informed Miss Janey GreaserForeman that there is a note in the chart stating that the daytime CSW called and informed [facility] at 1:52pm which is ample time for [facility] to arrange transportation.  Miss Janey GreaserForeman stated she understood.  CSW will continue to follow for D/C needs.  Dorothe PeaJonathan F. Jennavieve Arrick, Theresia MajorsLCSWA, LCAS Clinical Social Worker Ph: [phone]

## 2017-05-12 NOTE — ED Notes (Addendum)
Patient father at bedside. Nurse Florentina AddisonKatie asked the patient father if he could transport the patient back to his facility. Patient father refused and told nurse Florentina AddisonKatie to call [facility] to facilitate transportation. This Clinical research associatewriter will call now.

## 2017-05-12 NOTE — Progress Notes (Signed)
At approx 4:30pm CSW called police non-emergency phone number and requested from the police dispatcher that police be sent to the [facility] for a "wellness check" due to [facility] not returning the CSWs' phone call nor answering their phone when CSW's are calling.  CSWs' are calling to ask when [facility] is coming to pick pt up.  Pt has been medically/ psychiatrically stable for discharge and [facility] has been aware since before 2pm today, 05/12/17.    Dorothe PeaJonathan F. Kendarious Gudino, Theresia MajorsLCSWA, LCAS Clinical Social Worker Ph: [phone]

## 2017-05-12 NOTE — ED Notes (Signed)
Report received from previous RN. Bedside rounding completed. Patient and family updated on POC. Patient denies needs at this time.

## 2017-05-12 NOTE — Progress Notes (Signed)
[facility] and notified them patient was medically/ psychiatrically stable for discharge. Representative stated admissions rep. Would call me back. CSW waiting for return call.   Stacy GardnerErin Tiaria Biby, LCSWA Clinical Social Worker [phone]

## 2017-05-12 NOTE — ED Notes (Addendum)
[name] at [facility] called nurse [name] back. Russell BeaversHazel stated that she had gotten ahold of the patient's aunt, who was to call the patient's father, who was to call the patient's sister, who would hopefully be [facility] and transporting him back to his facility. Nurse [name] explained to [place]azel that the patient's father had already been to the patient's bedside to see the patient and, when asked if he would take the patient home, refused and told us to call the facility to arrange transport. Upon hearing this, the administrator states, "Well, I'll give his aunt a couple more minutes to call me back, and if she doesn't, we will have to find a back up plan. I'll call you back either way." Patient care PA notified of this chain of events.

## 2017-05-12 NOTE — ED Notes (Signed)
[facility] Jerrye BeaversHazel called [facility] and states she will be sending a transport team to pick up the patient at approx 1915.

## 2017-05-12 NOTE — Progress Notes (Signed)
CSW received a call from pt's RN stating she was told by pt's group home administrator Caroline MoreHazel Foreman that Miss Janey GreaserForeman would be the driver to pick the pt up.  Instead, two C.N.A.s with identification arrived to pick up the pt.  Per the RN, the C.N.A.'s were talking brusquely to the pt, as evidenced by telling the pt loudly to "get up, we gotta go", per the RN.  Per the RN, the RN educated the C.N.A.'s that the pt had, in her opinion, the cognitive abilities of a 51 year old" and the C.N.A.'s then adjusted their method of directing their pt, but stated, "We don't know anything about him, we were just told to pick him up", per the RN.    RN voiced concern to the CSW about the inability of the administrator Miss Janey GreaserForeman to properly staff the case with the C.N.A.'s who were in the opinion of the RN not properly prepared to adequately care for the pt during transport.  RN stated she has no hard evidence that the pt is or will be mistrated at this time, but feels concern for the pt due to the C.N.A.'s lack of information on the pt beforehand.  CSW was appreciative and thanked the Charity fundraiserN.  The CSW believes that an APS report would be screened out by APS with the information given by the RN at this time, but will staff the case with the CSW Asst Director.  RN was appreciative and thanked the CSW.  Please reconsult if future social work needs arise.    Dorothe PeaJonathan F. Ajax Schroll, Theresia MajorsLCSWA, LCAS Clinical Social Worker Ph: [phone]

## 2017-05-12 NOTE — ED Notes (Signed)
Patient continues to refuse medications. 

## 2017-05-12 NOTE — Progress Notes (Signed)
CSW called [facility] affiliated office and the office of [facility]'s [place] HeightsHazel Foreman who previously stated she would be arranging transportation for the pt and was told she "has left for the day".  CSW informed staff member Konrad SahaKiedre that the CSW would be calling the [facility] and DSS APS and reporting that [facility] had abandoned the pt.  Konrad SahaKiedre called back and stated transportation is on the way currently and to please call back at 7pm if transportation has not arrived.  CSW wil update RN.  Dorothe PeaJonathan F. Marvie Calender, Theresia MajorsLCSWA, LCAS Clinical Social Worker Ph: [phone]

## 2017-05-12 NOTE — ED Notes (Addendum)
[facility] administrative facilitator, Jerrye BeaversHazel, called nurse Marchelle FolksAmanda and asked this nurse to send the patient to the center via PTAR. Nurse Marchelle FolksAmanda informed the administrator that the patient did not qualify for PTAR, as he is ambulatory and capable of sitting in a wheelchair. Hazel then asked this nurse if I could put the patient in a cab and send him home. Nurse Marchelle FolksAmanda stated "I do not feel comfortable sending the patient alone in a cab or in public transportation due to his decreased mental capabilities." The administrator then stated she would call the patient's listed family members to see if they could transport the patient. Jerrye BeaversHazel stated she would call [facility] back with an update shortly.

## 2017-05-12 NOTE — ED Provider Notes (Signed)
Patient seen here yesterday for altered mental status. There was no acute medical explanation for this. Suspect that there is a psychiatric component, however patient does have mental delay/retardation which may be contributing as well. He was medically cleared. Psychiatry has evaluated patient, do not feel he meets inpatient criteria for placement at this time. Patient is to be discharged back to his facility. Report has been called by RN.  CSW was also involved in care.  On repeat assessment, patient calm but still refusing his meds. He did allow us to help change his clothes.  Vitals remain stable.  Seems appropriate for discharge back to facility as no medical necessity for hospitalization seen at this time.  5:53 PM RN's have been attempting to call report to patient's facility, [facility].  Apparently, they are refusing to come pick patient up and requested PTAR, however no qualifying diagnosis to justify this.  They requested patient be placed into cab, however given patient's mental retardation I do not feel this is a safe option.  Facility reports they are not coming to pick patient up, they recommended we call the patient's family to transport him back. Several family members have been contacted, however they keep requesting that we call other family members for transportation.  After multiple phone calls, patient's facility is now declining to accept patient back as they report they were unaware that patient was going to be coming back. Patient has been medically and psychiatrically cleared today for several hours now.  Patient is still awaiting transportation at this time.  7:30 PM-- staff from [facility] has arrived to transport patient back to facility.  Report has been called to his RN.  Patient remains stable at this time.   Garlon HatchetSanders, Jarin Cornfield M, PA-C 05/12/17 2055    Melene PlanFloyd, Dan, DO 05/13/17 [phone]

## 2017-05-13 ENCOUNTER — Emergency Department (HOSPITAL_COMMUNITY)
Admission: EM | Admit: 2017-05-13 | Discharge: 2017-05-24 | Disposition: A | Discharge status: Other Acute Inpatient Hospital | Payer: Medicare Other | Attending: Emergency Medicine | Admitting: Emergency Medicine

## 2017-05-13 ENCOUNTER — Encounter (HOSPITAL_COMMUNITY): Payer: Self-pay | Admitting: Emergency Medicine

## 2017-05-13 DIAGNOSIS — F200 Paranoid schizophrenia: Secondary | ICD-10-CM

## 2017-05-13 DIAGNOSIS — E876 Hypokalemia: Secondary | ICD-10-CM | POA: Diagnosis not present

## 2017-05-13 DIAGNOSIS — Z79899 Other long term (current) drug therapy: Secondary | ICD-10-CM | POA: Diagnosis not present

## 2017-05-13 DIAGNOSIS — F29 Unspecified psychosis not due to a substance or known physiological condition: Secondary | ICD-10-CM | POA: Diagnosis not present

## 2017-05-13 DIAGNOSIS — Z046 Encounter for general psychiatric examination, requested by authority: Secondary | ICD-10-CM | POA: Diagnosis not present

## 2017-05-13 DIAGNOSIS — R451 Restlessness and agitation: Secondary | ICD-10-CM | POA: Diagnosis present

## 2017-05-13 DIAGNOSIS — R443 Hallucinations, unspecified: Secondary | ICD-10-CM | POA: Diagnosis not present

## 2017-05-13 MED ORDER — STERILE WATER FOR INJECTION IJ SOLN
INTRAMUSCULAR | Status: AC
  Filled 2017-05-13: qty 10

## 2017-05-13 MED ORDER — DOCUSATE SODIUM 100 MG PO CAPS
100.0000 mg | ORAL_CAPSULE | Freq: Two times a day (BID) | ORAL | Status: DC
  Administered 2017-05-14 – 2017-05-24 (×4): 100 mg via ORAL
  Filled 2017-05-13 (×8): qty 1

## 2017-05-13 MED ORDER — METOPROLOL TARTRATE 25 MG PO TABS
25.0000 mg | ORAL_TABLET | Freq: Two times a day (BID) | ORAL | Status: DC
  Administered 2017-05-14 – 2017-05-23 (×4): 25 mg via ORAL
  Filled 2017-05-13 (×11): qty 1

## 2017-05-13 MED ORDER — TAMSULOSIN HCL 0.4 MG PO CAPS
0.4000 mg | ORAL_CAPSULE | Freq: Two times a day (BID) | ORAL | Status: DC
  Administered 2017-05-23 – 2017-05-24 (×2): 0.4 mg via ORAL
  Filled 2017-05-13 (×23): qty 1

## 2017-05-13 MED ORDER — ZOLPIDEM TARTRATE 5 MG PO TABS: 5.0000 mg | ORAL_TABLET | Freq: Every evening | ORAL | Status: DC | PRN

## 2017-05-13 MED ORDER — BENZTROPINE MESYLATE 0.5 MG PO TABS
0.5000 mg | ORAL_TABLET | Freq: Every day | ORAL | Status: DC
  Administered 2017-05-13 – 2017-05-24 (×4): 0.5 mg via ORAL
  Filled 2017-05-13 (×7): qty 1

## 2017-05-13 MED ORDER — ZIPRASIDONE MESYLATE 20 MG IM SOLR
20.0000 mg | Freq: Once | INTRAMUSCULAR | Status: AC
  Administered 2017-05-13: 20 mg via INTRAMUSCULAR
  Filled 2017-05-13: qty 20

## 2017-05-13 MED ORDER — LEVOTHYROXINE SODIUM 25 MCG PO TABS
12.5000 ug | ORAL_TABLET | Freq: Every day | ORAL | Status: DC
  Administered 2017-05-14: 09:00:00 via ORAL
  Administered 2017-05-20 – 2017-05-24 (×2): 12.5 ug via ORAL
  Filled 2017-05-13 (×11): qty 0.5

## 2017-05-13 MED ORDER — CHLORPROMAZINE HCL 25 MG PO TABS
100.0000 mg | ORAL_TABLET | Freq: Every day | ORAL | Status: DC
  Administered 2017-05-14: 100 mg via ORAL
  Filled 2017-05-13: qty 4

## 2017-05-13 MED ORDER — IBUPROFEN 200 MG PO TABS: 600.0000 mg | ORAL_TABLET | Freq: Three times a day (TID) | ORAL | Status: DC | PRN

## 2017-05-13 MED ORDER — PANTOPRAZOLE SODIUM 40 MG PO TBEC
40.0000 mg | DELAYED_RELEASE_TABLET | Freq: Every day | ORAL | Status: DC
  Administered 2017-05-23: 40 mg via ORAL
  Filled 2017-05-13 (×4): qty 1

## 2017-05-13 MED ORDER — ALUM & MAG HYDROXIDE-SIMETH 200-200-20 MG/5ML PO SUSP: 30.0000 mL | Freq: Four times a day (QID) | ORAL | Status: DC | PRN

## 2017-05-13 MED ORDER — TRAMADOL HCL 50 MG PO TABS
50.0000 mg | ORAL_TABLET | Freq: Two times a day (BID) | ORAL | Status: DC
  Administered 2017-05-14: 50 mg via ORAL
  Filled 2017-05-13 (×5): qty 1

## 2017-05-13 MED ORDER — FLUPHENAZINE HCL 5 MG PO TABS
10.0000 mg | ORAL_TABLET | Freq: Every day | ORAL | Status: DC
  Filled 2017-05-13: qty 2

## 2017-05-13 MED ORDER — ACETAMINOPHEN 325 MG PO TABS: 650.0000 mg | ORAL_TABLET | ORAL | Status: DC | PRN

## 2017-05-13 MED ORDER — ONDANSETRON HCL 4 MG PO TABS: 4.0000 mg | ORAL_TABLET | Freq: Three times a day (TID) | ORAL | Status: DC | PRN

## 2017-05-13 MED ORDER — CLONAZEPAM 0.5 MG PO TABS
0.5000 mg | ORAL_TABLET | Freq: Every day | ORAL | Status: DC
  Administered 2017-05-14: 0.5 mg via ORAL
  Filled 2017-05-13 (×3): qty 1

## 2017-05-13 MED ORDER — LURASIDONE HCL 40 MG PO TABS
120.0000 mg | ORAL_TABLET | Freq: Every day | ORAL | Status: DC
  Administered 2017-05-13: 120 mg via ORAL
  Filled 2017-05-13 (×6): qty 3

## 2017-05-13 NOTE — BH Assessment (Addendum)
Assessment Note  [name] is an 51 y.o. male that presents this date with altered mental status. Patient was unable to be assessed by this writer due to patient's current mental state. Patient will not respond to this writer's questions and stares at this writer. Information for purposes of assessment was gathered from admission notes and previous history. Per notes, "Patient was brought in by EMS and is a resident at [facility] where staff reports patient is not eating or taking medication/s. Patient was here on 6/20 for similar symptoms. This date patient is yelling "ICE CREAM" during triage. Per note review on previous assessment (6/21), "Patient has a history of intellectual disability, schizophrenia, thyroid disease, narcolepsy, psychosis, depressive disorder, presenting to the ED from [facility] for disruptive behavior. Per note review patient did not respond that date with any verbal responses. He did recently have some medication adjustments, his Latuda was reduced from 120 mg to 80 mg (information gathered from note on 6/20).Admission note this date states, "Patient was brought to ER from home. History of intellectual disablity. Reported not eating or drinking and increasing agitation. Here he continues to yell "ice cream" then have episodes of not talking or answering questions. Muttering to himself. Case was staffed with Arville Care NP who recommended patient be re-evaluated in the a.m.    Diagnosis: Schizophrenia  Past Medical History:  Past Medical History:  Diagnosis Date  . Constipation   . Intellectual disability   . Major depressive disorder   . Mental and behavioral problems with communication (including speech)   . Narcolepsy   . Psychosis   . Schizophrenia (HCC)   . Thyroid disease     History reviewed. No pertinent surgical history.  Family History: No family history on file.  Social History:  has no tobacco, alcohol, and drug  history on file.  Additional Social History:  Alcohol / Drug Use Pain Medications: See MAR Prescriptions: See MAR Over the Counter: See MAR History of alcohol / drug use?: No history of alcohol / drug abuse Longest period of sobriety (when/how long):  (Denies) Negative Consequences of Use:  (Denies) Withdrawal Symptoms:  (Denies)  CIWA: CIWA-Ar BP: (!) 124/93 (RN notified) Pulse Rate: (!) 139 (Pt. moving around during vitals) COWS:    Allergies: No Known Allergies  Home Medications:  (Not in a hospital admission)  OB/GYN Status:  No LMP for male patient.  General Assessment Data Location of Assessment: [facility] ED TTS Assessment: In system Is this a Tele or Face-to-Face Assessment?: Face-to-Face Is this an Initial Assessment or a Re-assessment for this encounter?: Initial Assessment Marital status: Single Maiden name:  (NA) Is patient pregnant?: No Pregnancy Status: No Living Arrangements: Group Home ([facility]) Can pt return to current living arrangement?: Yes Admission Status: Voluntary Is patient capable of signing voluntary admission?:  (UTA) Referral Source:  (UTA) Insurance type: MCR/MCD     Crisis Care Plan Living Arrangements: Group Home [facility]) Legal Guardian:  (UTA) Name of Psychiatrist: Estrella Deeds MD Name of Therapist:  Rich Reining)  Education Status Is patient currently in school?: No Current Grade:  (UTA) Highest grade of school patient has completed:  (UTA) Name of school:  (UTA) Contact person:  (UTA)  Risk to self with the past 6 months Suicidal Ideation:  (UTA) Has patient been a risk to self within the past 6 months prior to admission? :  (UTA) Suicidal Intent:  (UTA) Has patient had any suicidal intent within the past 6  months prior to admission? :  (UTA) Is patient at risk for suicide?:  (UTA) Suicidal Plan?:  (UTA) Has patient had any suicidal plan within the past 6 months prior to admission? :  (UTA) Access to Means:   (UTA) What has been your use of drugs/alcohol within the last 12 months?: N/A Previous Attempts/Gestures: No How many times?: 0 Other Self Harm Risks:  (UTA) Triggers for Past Attempts:  (UTA) Intentional Self Injurious Behavior:  (UTA) Family Suicide History: No Recent stressful life event(s):  (UTA) Persecutory voices/beliefs?:  (UTA) Depression:  (UTA) Depression Symptoms: Despondent Substance abuse history and/or treatment for substance abuse?: No Suicide prevention information given to non-admitted patients: Not applicable  Risk to Others within the past 6 months Homicidal Ideation:  (UTA) Does patient have any lifetime risk of violence toward others beyond the six months prior to admission? :  (UTA) Thoughts of Harm to Others:  (UTA) Current Homicidal Intent:  (UTA) Current Homicidal Plan:  (UTA) Access to Homicidal Means:  (UTA) Identified Victim:  (UTA) History of harm to others?:  (UTA) Assessment of Violence: None Noted Violent Behavior Description:  (UTA) Does patient have access to weapons?: No Criminal Charges Pending?: No Does patient have a court date: No Is patient on probation?: No  Psychosis Hallucinations:  (UTA) Delusions:  (UTA)  Mental Status Report Appearance/Hygiene: Unremarkable Eye Contact: Fair Motor Activity: Freedom of movement Speech:  (UTA) Level of Consciousness: Irritable Mood: Anxious Affect: Angry, Anxious Anxiety Level: Moderate Thought Processes:  (UTA) Judgement: Partial Orientation:  (UTA) Obsessive Compulsive Thoughts/Behaviors:  (UTA)  Cognitive Functioning Concentration:  (UTA) Memory:  (UTA) IQ:  (UTA) Level of Function: Severe/IDD Insight: Poor Impulse Control: Poor Appetite:  (UTA) Weight Loss:  (UTA) Weight Gain:  (UTA) Sleep:  (UTA) Total Hours of Sleep:  (UTA) Vegetative Symptoms:  (UTA)  ADLScreening [facility] Assessment Services) Patient's cognitive ability adequate to safely complete daily activities?:  Yes Patient able to express need for assistance with ADLs?: Yes Independently performs ADLs?: No  Prior Inpatient Therapy Prior Inpatient Therapy: Yes Prior Therapy Dates: Unknown Prior Therapy Facilty/Provider(s): [facility] Reason for Treatment: Unknown  Prior Outpatient Therapy Prior Outpatient Therapy: Yes Prior Therapy Dates: Current  Prior Therapy Facilty/Provider(s): [facility] Reason for Treatment: [facility] Does patient have an ACCT team?: No Does patient have Intensive In-House Services?  : No Does patient have [facility] services? : No Does patient have P4CC services?: No  ADL Screening (condition at time of admission) Patient's cognitive ability adequate to safely complete daily activities?: Yes Is the patient deaf or have difficulty hearing?: No Does the patient have difficulty seeing, even when wearing glasses/contacts?: Yes Does the patient have difficulty concentrating, remembering, or making decisions?: Yes Patient able to express need for assistance with ADLs?: Yes Does the patient have difficulty dressing or bathing?: Yes Independently performs ADLs?: No Communication: Needs assistance Is this a change from baseline?: Pre-admission baseline Dressing (OT): Needs assistance Is this a change from baseline?: Pre-admission baseline Grooming: Needs assistance Is this a change from baseline?: Pre-admission baseline Feeding: Independent Bathing: Needs assistance Is this a change from baseline?: Pre-admission baseline Toileting: Independent In/Out Bed: Independent Walks in Home: Independent Does the patient have difficulty walking or climbing stairs?: No Weakness of Legs: None Weakness of Arms/Hands: None  Home Assistive Devices/Equipment Home Assistive Devices/Equipment: None  Therapy Consults (therapy consults require a physician order) PT Evaluation Needed: No OT Evalulation Needed: No SLP Evaluation Needed: No Abuse/Neglect Assessment (Assessment to be  complete while patient  is alone) Physical Abuse: Denies Verbal Abuse: Denies Sexual Abuse: Denies Exploitation of patient/patient's resources: Denies Self-Neglect: Denies Values / Beliefs Cultural Requests During Hospitalization: None Spiritual Requests During Hospitalization: None Consults Spiritual Care Consult Needed: No Social Work Consult Needed: No Merchant navy officerAdvance Directives (For Healthcare) Does Patient Have a Medical Advance Directive?: No Would patient like information on creating a medical advance directive?: No - Patient declined    Additional Information 1:1 In Past 12 Months?: No CIRT Risk: No Elopement Risk: No Does patient have medical clearance?: Yes     Disposition: Case was staffed with Arville CareParks NP who recommended patient be re-evaluated in the a.m.   Disposition Initial Assessment Completed for this Encounter: Yes Disposition of Patient:  (Re-eval in the a.m.) Other disposition(s): Other (Comment) (Re-eval in the a.m.)  On Site Evaluation by:   Reviewed with Physician:    Alfredia Fergusonavid L Johnny Gorter 05/13/2017 6:18 PM

## 2017-05-13 NOTE — ED Notes (Signed)
NT along with Security at bedside for vitals.

## 2017-05-13 NOTE — ED Notes (Signed)
Pt refusing medications and meal tray. NT at the bedside to attempt to feed. Pt continues to refuse.

## 2017-05-13 NOTE — ED Notes (Addendum)
Pt refusing blood draw and medication admin. Security at bedside with this RN. EDP Campos notified.  Addended note: Pt grabs his crotch stating "blood, you want blood. Nuts. You want this?"

## 2017-05-13 NOTE — BH Assessment (Signed)
[facility] Assessment Progress Note  Case was staffed with Parks NP who recommended patient be re-evaluated in the a.m.      

## 2017-05-13 NOTE — ED Notes (Signed)
Pt changed out into scrubs and wanded by security. He is requesting [employer].

## 2017-05-13 NOTE — ED Provider Notes (Signed)
[facility]-EMERGENCY DEPT Provider Note   CSN: [id number] Arrival date & time: 05/13/17  1559     History   Chief Complaint Chief Complaint  Patient presents with  . Medical Clearance   Level v caveat: psychiatric issue HPI [name] is a 51 y.o. male.  HPI  Brought to ER from home. Hx of intellectual disablity. Reported not eating or drinking and increasing agitation. Here he continues to yell "ice cream" then have episodes of not talking or answering questions. Muttering to himself.   Past Medical History:  Diagnosis Date  . Constipation   . Intellectual disability   . Major depressive disorder   . Mental and behavioral problems with communication (including speech)   . Narcolepsy   . Psychosis   . Schizophrenia (HCC)   . Thyroid disease     Patient Active Problem List   Diagnosis Date Noted  . Schizophrenia (HCC) 05/12/2017    History reviewed. No pertinent surgical history.     Home Medications    Prior to Admission medications   Medication Sig Start Date End Date Taking? Authorizing Provider  acetaminophen (TYLENOL) 500 MG tablet Take 500 mg by mouth every 8 (eight) hours as needed for mild pain.   Yes [provider]  benztropine (COGENTIN) 0.5 MG tablet Take 0.5 mg by mouth daily.   Yes [provider]  chlorproMAZINE (THORAZINE) 100 MG tablet Take 100 mg by mouth at bedtime.   Yes [provider]  clonazePAM (KLONOPIN) 0.5 MG tablet Take 0.5 mg by mouth at bedtime.   Yes [provider]  docusate sodium (COLACE) 100 MG capsule Take 100 mg by mouth 2 (two) times daily.   Yes [provider]  fluPHENAZine (PROLIXIN) 10 MG tablet Take 10 mg by mouth at bedtime.    Yes [provider]  fluticasone (FLONASE) 50 MCG/ACT nasal spray Place 1 spray into both nostrils daily.   Yes [provider]  gemfibrozil (LOPID) 600 MG tablet Take 600 mg by mouth daily.   Yes [provider]  levothyroxine  (SYNTHROID, LEVOTHROID) 25 MCG tablet Take 12.5 mcg by mouth daily before breakfast.   Yes [provider]  Lurasidone HCl (LATUDA) 120 MG TABS Take 120 mg by mouth daily.   Yes [provider]  meloxicam (MOBIC) 7.5 MG tablet Take 7.5 mg by mouth daily as needed for pain.    Yes [provider]  metoprolol tartrate (LOPRESSOR) 25 MG tablet Take 25 mg by mouth 2 (two) times daily.   Yes [provider]  Multiple Vitamin (MULTIVITAMIN WITH MINERALS) TABS tablet Take 1 tablet by mouth daily.   Yes [provider]  omeprazole (PRILOSEC) 20 MG capsule Take 20 mg by mouth daily.   Yes [provider]  tamsulosin (FLOMAX) 0.4 MG CAPS capsule Take 0.4 mg by mouth 2 (two) times daily.   Yes [provider]  traMADol (ULTRAM) 50 MG tablet Take 50 mg by mouth 2 (two) times daily.   Yes [provider]  venlafaxine XR (EFFEXOR-XR) 150 MG 24 hr capsule Take 150 mg by mouth daily with breakfast.   Yes [provider]    Family History No family history on file.  Social History Social History  Substance Use Topics  . Smoking status: Unknown If Ever Smoked  . Smokeless tobacco: Not on file  . Alcohol use Not on file     Allergies   Patient has no known allergies.   Review of Systems  Review of Systems  Unable to perform ROS: Psychiatric disorder     Physical Exam Updated Vital Signs BP (!) 124/93 (BP Location: Left Arm) Comment: RN notified  Pulse (!) 139 Comment: Pt. moving around during vitals  Resp 19   SpO2 95%   Physical Exam  Constitutional: He appears well-developed and well-nourished.  HENT:  Head: Normocephalic.  Eyes: EOM are normal.  Neck: Normal range of motion.  Cardiovascular: Normal rate.   Pulmonary/Chest: Effort normal and breath sounds normal.  Abdominal: Soft. He exhibits no distension.  Musculoskeletal: Normal range of motion.  Neurological: He is alert.  Psychiatric: His affect is  inappropriate. Cognition and memory are impaired. He is noncommunicative. He is inattentive.  Nursing note and vitals reviewed.    ED Treatments / Results  Labs (all labs ordered are listed, but only abnormal results are displayed) Labs Reviewed  CBC  COMPREHENSIVE METABOLIC PANEL  ETHANOL  RAPID URINE DRUG SCREEN, HOSP PERFORMED    EKG  EKG Interpretation None       Radiology   Procedures Procedures (including critical care time)  Medications Ordered in ED Medications  benztropine (COGENTIN) tablet 0.5 mg (not administered)  chlorproMAZINE (THORAZINE) tablet 100 mg (not administered)  clonazePAM (KLONOPIN) tablet 0.5 mg (not administered)  docusate sodium (COLACE) capsule 100 mg (not administered)  fluPHENAZine (PROLIXIN) tablet 10 mg (not administered)  levothyroxine (SYNTHROID, LEVOTHROID) tablet 12.5 mcg (not administered)  Lurasidone HCl TABS 120 mg (not administered)  metoprolol tartrate (LOPRESSOR) tablet 25 mg (not administered)  pantoprazole (PROTONIX) EC tablet 40 mg (not administered)  tamsulosin (FLOMAX) capsule 0.4 mg (not administered)  traMADol (ULTRAM) tablet 50 mg (not administered)  acetaminophen (TYLENOL) tablet 650 mg (not administered)  alum & mag hydroxide-simeth (MAALOX/MYLANTA) 200-200-20 MG/5ML suspension 30 mL (not administered)  ibuprofen (ADVIL,MOTRIN) tablet 600 mg (not administered)  ondansetron (ZOFRAN) tablet 4 mg (not administered)  zolpidem (AMBIEN) tablet 5 mg (not administered)     Initial Impression / Assessment and Plan / ED Course  I have reviewed the triage vital signs and the nursing notes.  Pertinent labs & imaging results that were available during my care of the patient were reviewed by me and considered in my medical decision making (see chart for details).     Medically clear. TTS to evaluate  Final Clinical Impressions(s) / ED Diagnoses   Final diagnoses:  None    New Prescriptions New Prescriptions   No  medications on file     Azalia Bilis, MD 05/13/17 1715

## 2017-05-13 NOTE — ED Notes (Addendum)
Spoke to JC, [facility] r/t crushing meds.  Per [facility], the following meds may be crushed:  thorazine, klonopin, tramadol, synthroid, prolixin, & metoprolol.    Dr. Patria Maneampos informed.  Received verbal order to crush meds as listed above.

## 2017-05-13 NOTE — ED Notes (Signed)
Bed: [id number] Expected date:  Expected time:  Means of arrival:  Comments: EMS-depression

## 2017-05-13 NOTE — ED Triage Notes (Signed)
Pt via EMS, Resident at [facility] where staff reports patient is not eating or taking meds. Pt was here on 6/20 for the same thing "Pt normally talkative and friendly but today pt is yelling at them." Per MAR meds were given today. Yelling: "ICE CREAM" during triage. NAD at this time.

## 2017-05-13 NOTE — ED Notes (Signed)
Psych Assessment being completed by Onalee Huaavid.

## 2017-05-13 NOTE — ED Notes (Signed)
Pt still refusing blood draw and medication administration

## 2017-05-13 NOTE — ED Notes (Signed)
Security at bedside to assist. Pt resisted. No distress after med given.

## 2017-05-14 DIAGNOSIS — Z79899 Other long term (current) drug therapy: Secondary | ICD-10-CM | POA: Diagnosis not present

## 2017-05-14 DIAGNOSIS — F5089 Other specified eating disorder: Secondary | ICD-10-CM | POA: Diagnosis not present

## 2017-05-14 DIAGNOSIS — F200 Paranoid schizophrenia: Secondary | ICD-10-CM | POA: Diagnosis not present

## 2017-05-14 DIAGNOSIS — F79 Unspecified intellectual disabilities: Secondary | ICD-10-CM | POA: Diagnosis not present

## 2017-05-14 DIAGNOSIS — Z791 Long term (current) use of non-steroidal anti-inflammatories (NSAID): Secondary | ICD-10-CM

## 2017-05-14 LAB — COMPREHENSIVE METABOLIC PANEL
ALBUMIN: 4.5 g/dL (ref 3.5–5.0)
ALT: 29 U/L (ref 17–63)
AST: 49 U/L — ABNORMAL HIGH (ref 15–41)
Alkaline Phosphatase: 75 U/L (ref 38–126)
Anion gap: 13 (ref 5–15)
BUN: 18 mg/dL (ref 6–20)
CHLORIDE: 108 mmol/L (ref 101–111)
CO2: 22 mmol/L (ref 22–32)
Calcium: 9.7 mg/dL (ref 8.9–10.3)
Creatinine, Ser: 1.21 mg/dL (ref 0.61–1.24)
GFR calc Af Amer: >60 mL/min (ref >60)
GFR calc non Af Amer: >60 mL/min (ref >60)
GLUCOSE: 98 mg/dL (ref 65–99)
POTASSIUM: 3.7 mmol/L (ref 3.5–5.1)
SODIUM: 143 mmol/L (ref 135–145)
Total Bilirubin: 0.8 mg/dL (ref 0.3–1.2)
Total Protein: 8.7 g/dL — ABNORMAL HIGH (ref 6.5–8.1)

## 2017-05-14 LAB — CBC
HCT: 43.7 % (ref 39.0–52.0)
Hemoglobin: 15.3 g/dL (ref 13.0–17.0)
MCH: 30.3 pg (ref 26.0–34.0)
MCHC: 35 g/dL (ref 30.0–36.0)
MCV: 86.5 fL (ref 78.0–100.0)
Platelets: 334 10*3/uL (ref 150–400)
RBC: 5.05 MIL/uL (ref 4.22–5.81)
RDW: 14.3 % (ref 11.5–15.5)
WBC: 13.9 10*3/uL — ABNORMAL HIGH (ref 4.0–10.5)

## 2017-05-14 LAB — ETHANOL: Alcohol, Ethyl (B): <5 mg/dL (ref <5)

## 2017-05-14 MED ORDER — CHLORPROMAZINE HCL 25 MG PO TABS: 100.0000 mg | ORAL_TABLET | Freq: Two times a day (BID) | ORAL | Status: DC

## 2017-05-14 MED ORDER — TRAZODONE HCL 50 MG PO TABS
50.0000 mg | ORAL_TABLET | Freq: Every day | ORAL | Status: DC
  Administered 2017-05-23: 50 mg via ORAL
  Filled 2017-05-14 (×5): qty 1

## 2017-05-14 MED ORDER — SODIUM CHLORIDE 0.9 % IV BOLUS (SEPSIS)
1000.0000 mL | Freq: Once | INTRAVENOUS | Status: AC
  Administered 2017-05-14: 1000 mL via INTRAVENOUS

## 2017-05-14 MED ORDER — SODIUM CHLORIDE 0.9 % IV SOLN
Freq: Once | INTRAVENOUS | Status: AC
  Administered 2017-05-14: 15:00:00 via INTRAVENOUS

## 2017-05-14 MED ORDER — CHLORPROMAZINE HCL 25 MG PO TABS
100.0000 mg | ORAL_TABLET | Freq: Every day | ORAL | Status: DC
  Filled 2017-05-14 (×3): qty 4

## 2017-05-14 MED ORDER — TRAZODONE HCL 100 MG PO TABS: 100.0000 mg | ORAL_TABLET | Freq: Every evening | ORAL | Status: DC | PRN

## 2017-05-14 NOTE — Consult Note (Signed)
[facility] Face-to-Face Psychiatry Consult   Reason for Consult:  Psychiatric evaluation Referring Physician:  EDP Patient Identification: [name] MRN:  [id number] Principal Diagnosis: Paranoid schizophrenia [facility]) Diagnosis:   Patient Active Problem List   Diagnosis Date Noted  . Paranoid schizophrenia (HCC) [F20.0] 05/12/2017    Priority: High    Total Time spent with patient: 45 minutes  Subjective:   [name] is a 50 y.o. male patient admitted after he stopped eating and drinking.  HPI:   Patient with history of Paranoid Schizophrenia, Intellectual disability who was brought from [facility] after she stopped eating,  Drinking and refusing to take his medications. He is also getting easily agitated, aggressive with disorganized thought process. Patient is paranoid and reporting that he has been hearing voices in his head. Patient has been observed talking to himself as if responding to internal stimuli. Patient is a poor historian and has difficulty processing information.  Past Psychiatric History: as above  Risk to Self: Suicidal Ideation:  (UTA) Suicidal Intent:  (UTA) Is patient at risk for suicide?:  (UTA) Suicidal Plan?:  (UTA) Access to Means:  (UTA) What has been your use of drugs/alcohol within the last 12 months?: N/A How many times?: 0 Other Self Harm Risks:  (UTA) Triggers for Past Attempts:  (UTA) Intentional Self Injurious Behavior:  (UTA) Risk to Others: Homicidal Ideation:  (UTA) Thoughts of Harm to Others:  (UTA) Current Homicidal Intent:  (UTA) Current Homicidal Plan:  (UTA) Access to Homicidal Means:  (UTA) Identified Victim:  (UTA) History of harm to others?:  (UTA) Assessment of Violence: None Noted Violent Behavior Description:  (UTA) Does patient have access to weapons?: No Criminal Charges Pending?: No Does patient have a court date: No Prior Inpatient Therapy: Prior Inpatient Therapy: Yes Prior Therapy Dates:  Unknown Prior Therapy Facilty/Provider(s): [facility] Reason for Treatment: Unknown Prior Outpatient Therapy: Prior Outpatient Therapy: Yes Prior Therapy Dates: Current  Prior Therapy Facilty/Provider(s): [facility] Reason for Treatment: [facility] Does patient have an ACCT team?: No Does patient have Intensive In-House Services?  : No Does patient have [facility] services? : No Does patient have P4CC services?: No  Past Medical History:  Past Medical History:  Diagnosis Date  . Constipation   . Intellectual disability   . Major depressive disorder   . Mental and behavioral problems with communication (including speech)   . Narcolepsy   . Psychosis   . Schizophrenia (HCC)   . Thyroid disease    History reviewed. No pertinent surgical history. Family History: No family history on file. Family Psychiatric  History:  Social History:  History  Alcohol use Not on file     History  Drug use: Unknown    Social History   Social History  . Marital status: Single    Spouse name: N/A  . Number of children: N/A  . Years of education: N/A   Social History Main Topics  . Smoking status: Unknown If Ever Smoked  . Smokeless tobacco: None  . Alcohol use None  . Drug use: Unknown  . Sexual activity: Not Asked   Other Topics Concern  . None   Social History Narrative  . None   Additional Social History:    Allergies:  No Known Allergies  Labs:  Results for orders placed or performed during the hospital encounter of 05/13/17 (from the past 48 hour(s))  CBC     Status: Abnormal   Collection Time: 05/14/17 12:50 PM  Result Value Ref  Range   WBC 13.9 (H) 4.0 - 10.5 K/uL   RBC 5.05 4.22 - 5.81 MIL/uL   Hemoglobin 15.3 13.0 - 17.0 g/dL   HCT 16.1 09.6 - 04.5 %   MCV 86.5 78.0 - 100.0 fL   MCH 30.3 26.0 - 34.0 pg   MCHC 35.0 30.0 - 36.0 g/dL   RDW 40.9 81.1 - 91.4 %   Platelets 334 150 - 400 K/uL    Current Facility-Administered Medications  Medication Dose Route Frequency  Provider Last Rate Last Dose  . 0.9 %  sodium chloride infusion   Intravenous Once Donnetta Hutching, MD      . acetaminophen (TYLENOL) tablet 650 mg  650 mg Oral Q4H PRN Azalia Bilis, MD      . alum & mag hydroxide-simeth (MAALOX/MYLANTA) 200-200-20 MG/5ML suspension 30 mL  30 mL Oral Q6H PRN Azalia Bilis, MD      . benztropine (COGENTIN) tablet 0.5 mg  0.5 mg Oral Daily Azalia Bilis, MD   0.5 mg at 05/13/17 2021  . chlorproMAZINE (THORAZINE) tablet 100 mg  100 mg Oral QHS Cadie Sorci, MD      . clonazePAM (KLONOPIN) tablet 0.5 mg  0.5 mg Oral Fransico Setters, MD   0.5 mg at 05/14/17 0002  . docusate sodium (COLACE) capsule 100 mg  100 mg Oral BID Azalia Bilis, MD   100 mg at 05/14/17 0000  . ibuprofen (ADVIL,MOTRIN) tablet 600 mg  600 mg Oral Q8H PRN Azalia Bilis, MD      . levothyroxine (SYNTHROID, LEVOTHROID) tablet 12.5 mcg  12.5 mcg Oral QAC breakfast Azalia Bilis, MD      . lurasidone (LATUDA) tablet 120 mg  120 mg Oral Daily Zakari Couchman, MD   120 mg at 05/13/17 2020  . metoprolol tartrate (LOPRESSOR) tablet 25 mg  25 mg Oral BID Azalia Bilis, MD   25 mg at 05/14/17 0002  . ondansetron (ZOFRAN) tablet 4 mg  4 mg Oral Q8H PRN Azalia Bilis, MD      . pantoprazole (PROTONIX) EC tablet 40 mg  40 mg Oral Daily Azalia Bilis, MD      . tamsulosin (FLOMAX) capsule 0.4 mg  0.4 mg Oral BID Azalia Bilis, MD      . traMADol Janean Sark) tablet 50 mg  50 mg Oral BID Azalia Bilis, MD   50 mg at 05/14/17 0001  . traZODone (DESYREL) tablet 50 mg  50 mg Oral QHS Thedore Mins, MD       Current Outpatient Prescriptions  Medication Sig Dispense Refill  . acetaminophen (TYLENOL) 500 MG tablet Take 500 mg by mouth every 8 (eight) hours as needed for mild pain.    . benztropine (COGENTIN) 0.5 MG tablet Take 0.5 mg by mouth daily.    . chlorproMAZINE (THORAZINE) 100 MG tablet Take 100 mg by mouth at bedtime.    . clonazePAM (KLONOPIN) 0.5 MG tablet Take 0.5 mg by mouth at bedtime.    .  docusate sodium (COLACE) 100 MG capsule Take 100 mg by mouth 2 (two) times daily.    . fluPHENAZine (PROLIXIN) 10 MG tablet Take 10 mg by mouth at bedtime.     . fluticasone (FLONASE) 50 MCG/ACT nasal spray Place 1 spray into both nostrils daily.    Marland Kitchen gemfibrozil (LOPID) 600 MG tablet Take 600 mg by mouth daily.    Marland Kitchen levothyroxine (SYNTHROID, LEVOTHROID) 25 MCG tablet Take 12.5 mcg by mouth daily before breakfast.    . Lurasidone HCl (LATUDA) 120  MG TABS Take 120 mg by mouth daily.    . meloxicam (MOBIC) 7.5 MG tablet Take 7.5 mg by mouth daily as needed for pain.     . metoprolol tartrate (LOPRESSOR) 25 MG tablet Take 25 mg by mouth 2 (two) times daily.    . Multiple Vitamin (MULTIVITAMIN WITH MINERALS) TABS tablet Take 1 tablet by mouth daily.    Marland Kitchen. omeprazole (PRILOSEC) 20 MG capsule Take 20 mg by mouth daily.    . tamsulosin (FLOMAX) 0.4 MG CAPS capsule Take 0.4 mg by mouth 2 (two) times daily.    . traMADol (ULTRAM) 50 MG tablet Take 50 mg by mouth 2 (two) times daily.    Marland Kitchen. venlafaxine XR (EFFEXOR-XR) 150 MG 24 hr capsule Take 150 mg by mouth daily with breakfast.      Musculoskeletal: Strength & Muscle Tone: within normal limits Gait & Station: normal Patient leans: N/A  Psychiatric Specialty Exam: Physical Exam  Psychiatric: Judgment normal. His affect is blunt. His speech is delayed and tangential. He is agitated, aggressive and actively hallucinating. Thought content is paranoid. Cognition and memory are impaired.    Review of Systems  Constitutional: Negative.   HENT: Negative.   Eyes: Negative.   Respiratory: Negative.   Cardiovascular: Negative.   Gastrointestinal: Negative.   Genitourinary: Negative.   Musculoskeletal: Negative.   Skin: Negative.   Neurological: Negative.   Endo/Heme/Allergies: Negative.   Psychiatric/Behavioral: Positive for hallucinations.    Blood pressure (!) 137/99, pulse 95, temperature 98.9 F (37.2 C), temperature source Oral, resp. rate 20,  SpO2 100 %.There is no height or weight on file to calculate BMI.  General Appearance: Casual  Eye Contact:  Minimal  Speech:  Slow  Volume:  Normal  Mood:  Anxious  Affect:  Constricted  Thought Process:  Disorganized  Orientation:  Other:  only to person  Thought Content:  Illogical, Delusions and Hallucinations: Auditory  Suicidal Thoughts:  unable to assess  Homicidal Thoughts:  unable to assess  Memory:  unable to assess  Judgement:  Poor  Insight:  Lacking  Psychomotor Activity:  Restlessness  Concentration:  Concentration: Fair and Attention Span: Fair  Recall:  Poor  Fund of Knowledge:  Poor  Language:  Good  Akathisia:  No  Handed:  Right  AIMS (if indicated):     Assets:  Social Support  ADL's:  Impaired  Cognition:  WNL  Sleep:   fair     Treatment Plan Summary: Daily contact with patient to assess and evaluate symptoms and progress in treatment and Medication management Thorazine 100 mg Qhs and Latuda 120mg  daily for schizophrenia.  Disposition: Recommend psychiatric Inpatient admission when medically cleared.  Thedore MinsAkintayo, Lailanie Hasley, MD 05/14/2017 1:21 PM

## 2017-05-14 NOTE — ED Notes (Addendum)
Patient not eating or drinking or taking medications.

## 2017-05-14 NOTE — ED Notes (Signed)
Patient ate lunch when tech fed him.  Still not drinking fluids, but IV is established and infusing.

## 2017-05-14 NOTE — Progress Notes (Signed)
CSW faxed patient's IVC paperwork to magistrate office. CSW contacted Magistrate and confirmed paperwork received. Patient to be served. CSW filed patient's IVC paperwork into IVC logbook.   Russell SickleKimberly Roselia Snipe, LCSWA [facility]  Clinical Social Worker [phone]

## 2017-05-14 NOTE — Progress Notes (Signed)
Per psychiatrist Jannifer FranklinAkintayo, MD, patient meets inpatient criteria. Per chart review, patient has a history of intellectual disability. CSW contacted [facility] to inquire about patient's psychological testing. CSW spoke with staff member Onalee HuaDavid who reported that they do not have any of the patient's psychological testing on file. CSW inquired about family contacts for patient, staff agreed to contact CSW with contact information for patient's family members. CSW awaiting return call from staff to obtain family contact information to inquire about patient's psychological testing.   Celso SickleKimberly Muhammad Vacca, LCSWA [facility]  Clinical Social Worker [phone]

## 2017-05-14 NOTE — ED Notes (Signed)
Patient sitting up in bed not talking, only laughing out loud occasionally for no apparent reason.

## 2017-05-14 NOTE — ED Notes (Signed)
Pt refusing lab draw.

## 2017-05-14 NOTE — ED Notes (Signed)
Pt attempting to fill urinal with toilet water & drink it.   Urinal removed from pt and pt instructed to drink water provided.  Pt continues refusing to drink water provided by staff.

## 2017-05-14 NOTE — Progress Notes (Signed)
CSW contacted by staff member Onalee [facility]avid from [facility] and provided with the following contact information. Patient's father Yong Channel(Es[employer] Sh[employer]ield Sr[employer] [phone]) and Patient's aunt Melvyn Neth(Shirley Foster [phone]). CSW contacted patient's father and inquired about patient's psychological testing paperwork, patient's father reported that he doesn't have the paperwork and that the patient's mother may have it. Patient's father reported that he did not have any contact information for patient's mother. CSW contacted patient's aunt Melvyn Neth(Shirley Foster [phone]) and inquired about patient's psychological testing. Patient's aunt reported that she believes it was completed 3 or 4 years [facility]. Patient's aunt reported that she does not have a copy and doesn't think anyone in the family has a copy. CSW inquired about patient's mother, patient's aunt reported that her first name is Delray AltMargie and she does not have contact information for her.   CSW [facility] and [facility]. CSW still waiting to obtain patient's psychological testing and will send to facilities once received.   Celso SickleKimberly Travarius Lange, LCSWA [facility]  Clinical Social Worker [phone]

## 2017-05-14 NOTE — ED Notes (Addendum)
Patient given his thyroid medication in a spoonful of applesauce.  Patient able to get it down.

## 2017-05-15 DIAGNOSIS — F79 Unspecified intellectual disabilities: Secondary | ICD-10-CM | POA: Diagnosis not present

## 2017-05-15 DIAGNOSIS — Z79899 Other long term (current) drug therapy: Secondary | ICD-10-CM | POA: Diagnosis not present

## 2017-05-15 DIAGNOSIS — Z791 Long term (current) use of non-steroidal anti-inflammatories (NSAID): Secondary | ICD-10-CM | POA: Diagnosis not present

## 2017-05-15 DIAGNOSIS — F200 Paranoid schizophrenia: Secondary | ICD-10-CM | POA: Diagnosis not present

## 2017-05-15 LAB — CBC WITH DIFFERENTIAL/PLATELET
Basophils Absolute: 0 10*3/uL (ref 0.0–0.1)
Basophils Relative: 0 %
EOS PCT: 1 %
Eosinophils Absolute: 0.2 10*3/uL (ref 0.0–0.7)
HCT: 39.9 % (ref 39.0–52.0)
HEMOGLOBIN: 13.8 g/dL (ref 13.0–17.0)
LYMPHS ABS: 2.8 10*3/uL (ref 0.7–4.0)
LYMPHS PCT: 22 %
MCH: 30.5 pg (ref 26.0–34.0)
MCHC: 34.6 g/dL (ref 30.0–36.0)
MCV: 88.1 fL (ref 78.0–100.0)
Monocytes Absolute: 1.1 10*3/uL — ABNORMAL HIGH (ref 0.1–1.0)
Monocytes Relative: 9 %
Neutro Abs: 8.7 10*3/uL — ABNORMAL HIGH (ref 1.7–7.7)
Neutrophils Relative %: 68 %
Platelets: 274 10*3/uL (ref 150–400)
RBC: 4.53 MIL/uL (ref 4.22–5.81)
RDW: 14.4 % (ref 11.5–15.5)
WBC: 12.8 10*3/uL — AB (ref 4.0–10.5)

## 2017-05-15 LAB — BASIC METABOLIC PANEL
Anion gap: 13 (ref 5–15)
BUN: 15 mg/dL (ref 6–20)
CHLORIDE: 111 mmol/L (ref 101–111)
CO2: 20 mmol/L — AB (ref 22–32)
Calcium: 9 mg/dL (ref 8.9–10.3)
Creatinine, Ser: 1 mg/dL (ref 0.61–1.24)
GFR calc Af Amer: >60 mL/min (ref >60)
GFR calc non Af Amer: >60 mL/min (ref >60)
GLUCOSE: 86 mg/dL (ref 65–99)
POTASSIUM: 3.8 mmol/L (ref 3.5–5.1)
Sodium: 144 mmol/L (ref 135–145)

## 2017-05-15 MED ORDER — SODIUM CHLORIDE 0.9 % IV BOLUS (SEPSIS)
1000.0000 mL | Freq: Once | INTRAVENOUS | Status: AC
  Administered 2017-05-15: 1000 mL via INTRAVENOUS

## 2017-05-15 NOTE — ED Notes (Signed)
Patient remains non-verbal.  No response when nurse was trying to draw blood.

## 2017-05-15 NOTE — Consult Note (Signed)
[facility] Psych ED Progress Note  05/15/2017 11:21 AM [name]  MRN:  [id number]   Subjective: " I am hearing voices in my head.''  Objective: Patient was seen, interviewed, chart reviewed and case discussed with treatment team. He is a poor historian with history of Paranoid Schizophrenia and Intellectual disability. Patient reports that he is still hearing voices in his head, he remains paranoid with disorganized thought process. However, he has been eating but still needs encouragement to drink.    Principal Problem: Paranoid schizophrenia (HCC) Diagnosis:   Patient Active Problem List   Diagnosis Date Noted  . Paranoid schizophrenia (HCC) [F20.0] 05/12/2017    Priority: High   Total Time spent with patient: 30 minutes  Past Psychiatric History: as above  Past Medical History:  Past Medical History:  Diagnosis Date  . Constipation   . Intellectual disability   . Major depressive disorder   . Mental and behavioral problems with communication (including speech)   . Narcolepsy   . Psychosis   . Schizophrenia (HCC)   . Thyroid disease    History reviewed. No pertinent surgical history. Family History: No family history on file. Family Psychiatric  History:  Social History:  History  Alcohol use Not on file     History  Drug use: Unknown    Social History   Social History  . Marital status: Single    Spouse name: N/A  . Number of children: N/A  . Years of education: N/A   Social History Main Topics  . Smoking status: Unknown If Ever Smoked  . Smokeless tobacco: None  . Alcohol use None  . Drug use: Unknown  . Sexual activity: Not Asked   Other Topics Concern  . None   Social History Narrative  . None    Sleep: Fair  Appetite:  Fair  Current Medications: Current Facility-Administered Medications  Medication Dose Route Frequency Provider Last Rate Last Dose  . acetaminophen (TYLENOL) tablet 650 mg  650 mg Oral Q4H PRN Azalia Bilis, MD      . alum & mag  hydroxide-simeth (MAALOX/MYLANTA) 200-200-20 MG/5ML suspension 30 mL  30 mL Oral Q6H PRN Azalia Bilis, MD      . benztropine (COGENTIN) tablet 0.5 mg  0.5 mg Oral Daily Azalia Bilis, MD   0.5 mg at 05/13/17 2021  . chlorproMAZINE (THORAZINE) tablet 100 mg  100 mg Oral QHS Aiden Helzer, MD      . clonazePAM (KLONOPIN) tablet 0.5 mg  0.5 mg Oral Fransico Setters, MD   0.5 mg at 05/14/17 0002  . docusate sodium (COLACE) capsule 100 mg  100 mg Oral BID Azalia Bilis, MD   100 mg at 05/14/17 0000  . ibuprofen (ADVIL,MOTRIN) tablet 600 mg  600 mg Oral Q8H PRN Azalia Bilis, MD      . levothyroxine (SYNTHROID, LEVOTHROID) tablet 12.5 mcg  12.5 mcg Oral QAC breakfast Azalia Bilis, MD      . lurasidone (LATUDA) tablet 120 mg  120 mg Oral Daily Alahna Dunne, MD   120 mg at 05/13/17 2020  . metoprolol tartrate (LOPRESSOR) tablet 25 mg  25 mg Oral BID Azalia Bilis, MD   25 mg at 05/14/17 0002  . ondansetron (ZOFRAN) tablet 4 mg  4 mg Oral Q8H PRN Azalia Bilis, MD      . pantoprazole (PROTONIX) EC tablet 40 mg  40 mg Oral Daily Azalia Bilis, MD      . tamsulosin [facility]) capsule 0.4 mg  0.4 mg Oral  BID Jola Schmidt, MD      . traMADol Veatrice Bourbon) tablet 50 mg  50 mg Oral BID Jola Schmidt, MD   50 mg at 05/14/17 0001  . traZODone (DESYREL) tablet 50 mg  50 mg Oral QHS Corena Pilgrim, MD       Current Outpatient Prescriptions  Medication Sig Dispense Refill  . acetaminophen (TYLENOL) 500 MG tablet Take 500 mg by mouth every 8 (eight) hours as needed for mild pain.    . benztropine (COGENTIN) 0.5 MG tablet Take 0.5 mg by mouth daily.    . chlorproMAZINE (THORAZINE) 100 MG tablet Take 100 mg by mouth at bedtime.    . clonazePAM (KLONOPIN) 0.5 MG tablet Take 0.5 mg by mouth at bedtime.    . docusate sodium (COLACE) 100 MG capsule Take 100 mg by mouth 2 (two) times daily.    . fluPHENAZine (PROLIXIN) 10 MG tablet Take 10 mg by mouth at bedtime.     . fluticasone (FLONASE) 50 MCG/ACT nasal spray  Place 1 spray into both nostrils daily.    Marland Kitchen gemfibrozil (LOPID) 600 MG tablet Take 600 mg by mouth daily.    Marland Kitchen levothyroxine (SYNTHROID, LEVOTHROID) 25 MCG tablet Take 12.5 mcg by mouth daily before breakfast.    . Lurasidone HCl (LATUDA) 120 MG TABS Take 120 mg by mouth daily.    . meloxicam (MOBIC) 7.5 MG tablet Take 7.5 mg by mouth daily as needed for pain.     . metoprolol tartrate (LOPRESSOR) 25 MG tablet Take 25 mg by mouth 2 (two) times daily.    . Multiple Vitamin (MULTIVITAMIN WITH MINERALS) TABS tablet Take 1 tablet by mouth daily.    Marland Kitchen omeprazole (PRILOSEC) 20 MG capsule Take 20 mg by mouth daily.    . tamsulosin (FLOMAX) 0.4 MG CAPS capsule Take 0.4 mg by mouth 2 (two) times daily.    . traMADol (ULTRAM) 50 MG tablet Take 50 mg by mouth 2 (two) times daily.    Marland Kitchen venlafaxine XR (EFFEXOR-XR) 150 MG 24 hr capsule Take 150 mg by mouth daily with breakfast.      Lab Results:  Results for orders placed or performed during the hospital encounter of 05/13/17 (from the past 48 hour(s))  CBC     Status: Abnormal   Collection Time: 05/14/17 12:50 PM  Result Value Ref Range   WBC 13.9 (H) 4.0 - 10.5 K/uL   RBC 5.05 4.22 - 5.81 MIL/uL   Hemoglobin 15.3 13.0 - 17.0 g/dL   HCT 43.7 39.0 - 52.0 %   MCV 86.5 78.0 - 100.0 fL   MCH 30.3 26.0 - 34.0 pg   MCHC 35.0 30.0 - 36.0 g/dL   RDW 14.3 11.5 - 15.5 %   Platelets 334 150 - 400 K/uL  Comprehensive metabolic panel     Status: Abnormal   Collection Time: 05/14/17 12:50 PM  Result Value Ref Range   Sodium 143 135 - 145 mmol/L   Potassium 3.7 3.5 - 5.1 mmol/L   Chloride 108 101 - 111 mmol/L   CO2 22 22 - 32 mmol/L   Glucose, Bld 98 65 - 99 mg/dL   BUN 18 6 - 20 mg/dL   Creatinine, Ser 1.21 0.61 - 1.24 mg/dL   Calcium 9.7 8.9 - 10.3 mg/dL   Total Protein 8.7 (H) 6.5 - 8.1 g/dL   Albumin 4.5 3.5 - 5.0 g/dL   AST 49 (H) 15 - 41 U/L   ALT 29 17 - 63 U/L  Alkaline Phosphatase 75 38 - 126 U/L   Total Bilirubin 0.8 0.3 - 1.2 mg/dL   GFR  calc non Af Amer >60 >60 mL/min   GFR calc Af Amer >60 >60 mL/min    Comment: (NOTE) The eGFR has been calculated using the CKD EPI equation. This calculation has not been validated in all clinical situations. eGFR's persistently <60 mL/min signify possible Chronic Kidney Disease.    Anion gap 13 5 - 15  Ethanol     Status: None   Collection Time: 05/14/17 12:50 PM  Result Value Ref Range   Alcohol, Ethyl (B) <5 <5 mg/dL    Comment:        LOWEST DETECTABLE LIMIT FOR SERUM ALCOHOL IS 5 mg/dL FOR MEDICAL PURPOSES ONLY     Blood Alcohol level:  Lab Results  Component Value Date   ETH <5 05/14/2017   ETH <5 05/11/2017    Physical Findings: AIMS:  , ,  ,  ,    CIWA:    COWS:     Musculoskeletal: Strength & Muscle Tone: within normal limits Gait & Station: normal Patient leans: N/A  Psychiatric Specialty Exam: Physical Exam  Psychiatric: Judgment normal. His affect is blunt. His speech is delayed. He is agitated and actively hallucinating. Thought content is paranoid. Cognition and memory are impaired.    Review of Systems  Constitutional: Negative.   HENT: Negative.   Eyes: Negative.   Respiratory: Negative.   Cardiovascular: Negative.   Gastrointestinal: Negative.   Genitourinary: Negative.   Musculoskeletal: Negative.   Skin: Negative.   Neurological: Negative.   Endo/Heme/Allergies: Negative.   Psychiatric/Behavioral: Positive for depression and hallucinations. The patient is nervous/anxious.     Blood pressure 134/73, pulse 85, temperature 97.6 F (36.4 C), temperature source Oral, resp. rate 19, SpO2 92 %.There is no height or weight on file to calculate BMI.  General Appearance: Casual  Eye Contact:  Minimal  Speech:  Slow  Volume:  Decreased  Mood:  Dysphoric  Affect:  Constricted  Thought Process:  Disorganized  Orientation:  Other:  only to person  Thought Content:  Illogical, Delusions and Hallucinations: Auditory  Suicidal Thoughts:  No   Homicidal Thoughts:  No  Memory:  Immediate;   Fair Recent;   Poor Remote;   Poor  Judgement:  Impaired  Insight:  Shallow  Psychomotor Activity:  Psychomotor Retardation  Concentration:  Concentration: Fair and Attention Span: Fair  Recall:  Poor  Fund of Knowledge:  Poor  Language:  Fair  Akathisia:  No  Handed:  Right  AIMS (if indicated):     Assets:  Social Support  ADL's:  Impaired  Cognition:  WNL  Sleep:   fair      Treatment Plan Summary: Daily contact with patient to assess and evaluate symptoms and progress in treatment and Medication management  Continue Thorazine 100 mg Qhs and Latuda 153m daily for schizophrenia.  Disposition: Recommend psychiatric Inpatient admission when medically cleared.    ACorena Pilgrim MD 05/15/2017, 11:21 AM

## 2017-05-15 NOTE — [facility] Counselor (Signed)
Patient reassessed:  Patient did not respond to questions.  Patient did not make eye contact.  Patient was softly mumbling (unable to hear content) and at one point suddenly started crying and then abruptly stopped.  Patient continues to meet inpatient criteria and placement continues to be sought.

## 2017-05-15 NOTE — ED Notes (Signed)
Pt is not eating or drinking fluids, pt is not taking any medications, even when crushed and put in applesauce; pt is incontinent of urine, pt is not verbally responding to staff

## 2017-05-15 NOTE — ED Provider Notes (Addendum)
  Physical Exam  BP 134/73 (BP Location: Left Arm)   Pulse 85   Temp 97.6 F (36.4 C) (Oral)   Resp 19   SpO2 92%   Physical Exam  ED Course  Procedures  MDM Patient sleeping this AM. Pending social work placement.   3:58 PM Patient refused to eat today. Repeat labs stable. CO2 20 but nl AG. Will give another 1 L NS bolus. Doesn't meet admission criteria. Still pending placement.    Charlynne PanderYao, Shailen Thielen Hsienta, MD 05/15/17 [id number]    Charlynne PanderYao, Salam Chesterfield Hsienta, MD 05/15/17 [phone]

## 2017-05-15 NOTE — ED Notes (Signed)
Phlebotomy at bedside to draw blood after nurse tried x 3 unsuccessfully.

## 2017-05-15 NOTE — ED Notes (Signed)
Assisted sitter with bathing and changing patient.  Patient able to ambulate when led by the hand.  Will not attempt to wash himself or tend to any ADL's.  Patient is a total assist.  Did not eat any breakfast or take in any fluids at breakfast.  Only had several bites of food at lunch, would not take any fluids.  ED doctor notified.

## 2017-05-15 NOTE — ED Notes (Signed)
PIV in Left AC, flushed with saline, clean, dry & intact

## 2017-05-15 NOTE — ED Notes (Addendum)
Patient still not responding to verbal stimuli.  Food and fluids offered.  Patient not eating or drinking.

## 2017-05-16 MED ORDER — LORAZEPAM 1 MG PO TABS
1.0000 mg | ORAL_TABLET | Freq: Three times a day (TID) | ORAL | Status: DC
  Filled 2017-05-16: qty 1

## 2017-05-16 NOTE — BH Assessment (Addendum)
[facility] Assessment Progress Note This Clinical research associatewriter spoke with patient this date to re-assess and evaluate treatment progress. Patient is non-verbal and will not interact with this Clinical research associatewriter. Patient continually shakes his head to the left and right and does not seem to process the content of this writer's questions. Collateral gathered from staff nurse reports patient has not been compliant with his medication regimen and refuses to open his mouth. Case was staffed with Akintayo MD who recommended continued inpatient monitoring as appropriate bed placement is investigated.

## 2017-05-16 NOTE — ED Notes (Signed)
Pt is still refusing any PO intake, including fluids, he is non-verbal and will not even open his mouth.

## 2017-05-16 NOTE — ED Notes (Signed)
Pt. Got agitated upon V/S check, uncooperative and restless. Pt.REFUSED all meds scheduled at this time, verbally abusive and aggressive. Kicking on the bed and screaming also noted.

## 2017-05-16 NOTE — BH Assessment (Addendum)
BHH Assessment Progress Note  Per Thedore MinsMojeed Akintayo, MD, this pt requires psychiatric hospitalization at this time.  Pt presents under IVC initiated by Dr Jannifer FranklinAkintayo.  Pt's notes in EPIC indicate that pt is diagnosed with IDD.  This Clinical research associatewriter called the [facility] to see if pt has an IDD care coordinator, or any documentation to substantiate this diagnosis.  At 12:13 I spoke to [place], who reports that pt does not have a care coordinator, and that they have no record of pt having psychometric testing or a guardian.  She reports that pt has received treatment through [facility] [place] [phone]).  At 12:35 this writer called Antionette CharDavid Ancrum [phone]) with [facility], pt's place of residence.  He reports that he has no record of pt being diagnosed with IDD.  Their record does not include psychometric testing.  To his knowledge pt has not received [facility] from [facility].  Pt is his own guardian.  He notes that pt has [facility] in the past.  At 12:42 I called Vernona RiegerLaura at the [facility].  She confirms that pt last  received outpatient services at their facility in October 2017, and was last hospitalized there in July 2011.  She reports that they do not have any beds available at this time, and that the [facility] does not either, but that the [facility] might.  I then called the [facility] and spoke to [employer]ela McNeil (phone: [phone] fax: [phone]).  She confirms that they have beds, and then took demographic information.  Referral has been faxed to them with results pending, and I have been unable to reach [place] to confirm receipt.  Doylene Canninghomas Samah Lapiana, MA Triage Specialist [phone]   Addendum:  At 15:21 Marylene Landngela calls back from the [facility].  She reports that she has received referral, but that their registration department has not completed entering the pt yet.  She reports that her shift is ending,  and she will review referral in the morning.   Doylene Canninghomas Ariana Juul, MA Triage Specialist [phone]

## 2017-05-17 DIAGNOSIS — F200 Paranoid schizophrenia: Secondary | ICD-10-CM

## 2017-05-17 DIAGNOSIS — F29 Unspecified psychosis not due to a substance or known physiological condition: Secondary | ICD-10-CM

## 2017-05-17 DIAGNOSIS — Z79899 Other long term (current) drug therapy: Secondary | ICD-10-CM | POA: Diagnosis not present

## 2017-05-17 NOTE — ED Notes (Signed)
Patient yelling for "[name]" and kicking his feet in the air

## 2017-05-17 NOTE — BH Assessment (Addendum)
BHH Assessment Progress Note  At 09:41 this Clinical research associatewriter called the [facility] and spoke to HunterAngela.  She reports that their registration department has still not loaded pt into their system.  She will call me as the referral develops.  At 09:58 I called th [facility] and spoke to April.  She reports that they do not have any beds available currently, but she will call if that changes.  She also reports that neither the [facility] nor [facility] have any beds available at this time.  Doylene Canninghomas Tayden Duran, MA Triage Specialist [phone]   Addendum:  At 15:20 Marylene Landngela calls from the [facility] to report that no beds are available at this time.  Doylene Canninghomas Misbah Hornaday, MA Triage Specialist [phone]

## 2017-05-17 NOTE — Consult Note (Signed)
[facility] Consult   Reason for Consult:  Psychosis Referring Physician:  EDP Patient Identification: [name] MRN:  [id number] Principal Diagnosis: Paranoid schizophrenia [facility]) Diagnosis:   Patient Active Problem List   Diagnosis Date Noted  . Paranoid schizophrenia ([place]) [F20.0] 05/12/2017    Total Time spent with patient: 30 minutes  Subjective:   [name] is a 51 y.o. male patient admitted with paranoid schizophrenia and psychosis.  HPI:  [name] is a 51 year old male admitted to [facility] with paranoid schizophrenia and acute psychosis. Pt is non communicative. Pt did not respond to any questions he was asked. Pt was lying on the bed with his legs out straight and feet elevated off the bed. Pt frequently hollers out and laughs loudly. Pt will only say ice cream when he does speak. Pt would benefit from inpatient psychiatric admission. TTS to continue to seek placement.   Past Psychiatric History: Paranoid schizophrenia,   Risk to Self: Suicidal Ideation:  (UTA) Suicidal Intent:  (UTA) Is patient at risk for suicide?:  (UTA) Suicidal Plan?:  (UTA) Access to Means:  (UTA) What has been your use of drugs/alcohol within the last 12 months?: N/A How many times?: 0 Other Self Harm Risks:  (UTA) Triggers for Past Attempts:  (UTA) Intentional Self Injurious Behavior:  (UTA) Risk to Others: Homicidal Ideation:  (UTA) Thoughts of Harm to Others:  (UTA) Current Homicidal Intent:  (UTA) Current Homicidal Plan:  (UTA) Access to Homicidal Means:  (UTA) Identified Victim:  (UTA) History of harm to others?:  (UTA) Assessment of Violence: None Noted Violent Behavior Description:  (UTA) Does patient have access to weapons?: No Criminal Charges Pending?: No Does patient have a court date: No Prior Inpatient Therapy: Prior Inpatient Therapy: Yes Prior Therapy Dates: Unknown Prior Therapy Facilty/Provider(s): [facility] Reason for Treatment: Unknown Prior Outpatient  Therapy: Prior Outpatient Therapy: Yes Prior Therapy Dates: Current  Prior Therapy Facilty/Provider(s): [facility] Reason for Treatment: [facility] Does patient have an ACCT team?: No Does patient have Intensive In-House Services?  : No Does patient have [facility] services? : No Does patient have P4CC services?: No  Past Medical History:  Past Medical History:  Diagnosis Date  . Constipation   . Intellectual disability   . Major depressive disorder   . Mental and behavioral problems with communication (including speech)   . Narcolepsy   . Psychosis   . Schizophrenia ([place])   . Thyroid disease    History reviewed. No pertinent surgical history. Family History: No family history on file. Family Psychiatric  History: Unknown Social History:  History  Alcohol use Not on file     History  Drug use: Unknown    Social History   Social History  . Marital status: Single    Spouse name: N/A  . Number of children: N/A  . Years of education: N/A   Social History Main Topics  . Smoking status: Unknown If Ever Smoked  . Smokeless tobacco: None  . Alcohol use None  . Drug use: Unknown  . Sexual activity: Not Asked   Other Topics Concern  . None   Social History Narrative  . None   Additional Social History:    Allergies:  No Known Allergies  Labs:  Results for orders placed or performed during the hospital encounter of 05/13/17 (from the past 48 hour(s))  CBC with Differential/Platelet     Status: Abnormal   Collection Time: 05/15/17  3:10 PM  Result Value Ref Range  WBC 12.8 (H) 4.0 - 10.5 K/uL   RBC 4.53 4.22 - 5.81 MIL/uL   Hemoglobin 13.8 13.0 - 17.0 g/dL   HCT 39.9 39.0 - 52.0 %   MCV 88.1 78.0 - 100.0 fL   MCH 30.5 26.0 - 34.0 pg   MCHC 34.6 30.0 - 36.0 g/dL   RDW 14.4 11.5 - 15.5 %   Platelets 274 150 - 400 K/uL   Neutrophils Relative % 68 %   Neutro Abs 8.7 (H) 1.7 - 7.7 K/uL   Lymphocytes Relative 22 %   Lymphs Abs 2.8 0.7 - 4.0 K/uL   Monocytes  Relative 9 %   Monocytes Absolute 1.1 (H) 0.1 - 1.0 K/uL   Eosinophils Relative 1 %   Eosinophils Absolute 0.2 0.0 - 0.7 K/uL   Basophils Relative 0 %   Basophils Absolute 0.0 0.0 - 0.1 K/uL  Basic metabolic panel     Status: Abnormal   Collection Time: 05/15/17  3:10 PM  Result Value Ref Range   Sodium 144 135 - 145 mmol/L   Potassium 3.8 3.5 - 5.1 mmol/L   Chloride 111 101 - 111 mmol/L   CO2 20 (L) 22 - 32 mmol/L   Glucose, Bld 86 65 - 99 mg/dL   BUN 15 6 - 20 mg/dL   Creatinine, Ser 1.00 0.61 - 1.24 mg/dL   Calcium 9.0 8.9 - 10.3 mg/dL   GFR calc non Af Amer >60 >60 mL/min   GFR calc Af Amer >60 >60 mL/min    Comment: (NOTE) The eGFR has been calculated using the CKD EPI equation. This calculation has not been validated in all clinical situations. eGFR's persistently <60 mL/min signify possible Chronic Kidney Disease.    Anion gap 13 5 - 15    Current Facility-Administered Medications  Medication Dose Route Frequency Provider Last Rate Last Dose  . acetaminophen (TYLENOL) tablet 650 mg  650 mg Oral Q4H PRN Jola Schmidt, MD      . alum & mag hydroxide-simeth (MAALOX/MYLANTA) 200-200-20 MG/5ML suspension 30 mL  30 mL Oral Q6H PRN Jola Schmidt, MD      . benztropine (COGENTIN) tablet 0.5 mg  0.5 mg Oral Daily Jola Schmidt, MD   0.5 mg at 05/13/17 2021  . chlorproMAZINE (THORAZINE) tablet 100 mg  100 mg Oral QHS Bernhard Koskinen, MD      . docusate sodium (COLACE) capsule 100 mg  100 mg Oral BID Jola Schmidt, MD   100 mg at 05/14/17 0000  . ibuprofen (ADVIL,MOTRIN) tablet 600 mg  600 mg Oral Q8H PRN Jola Schmidt, MD      . levothyroxine (SYNTHROID, LEVOTHROID) tablet 12.5 mcg  12.5 mcg Oral QAC breakfast Jola Schmidt, MD      . LORazepam (ATIVAN) tablet 1 mg  1 mg Oral TID Luvinia Lucy, MD      . lurasidone (LATUDA) tablet 120 mg  120 mg Oral Daily Jaydi Bray, MD   120 mg at 05/13/17 2020  . metoprolol tartrate (LOPRESSOR) tablet 25 mg  25 mg Oral BID Jola Schmidt, MD   25 mg at 05/14/17 0002  . ondansetron (ZOFRAN) tablet 4 mg  4 mg Oral Q8H PRN Jola Schmidt, MD      . pantoprazole (PROTONIX) EC tablet 40 mg  40 mg Oral Daily Jola Schmidt, MD      . tamsulosin (FLOMAX) capsule 0.4 mg  0.4 mg Oral BID Jola Schmidt, MD      . traMADol Veatrice Bourbon) tablet 50 mg  50 mg Oral BID Jola Schmidt, MD   50 mg at 05/14/17 0001  . traZODone (DESYREL) tablet 50 mg  50 mg Oral QHS Corena Pilgrim, MD       Current Outpatient Prescriptions  Medication Sig Dispense Refill  . acetaminophen (TYLENOL) 500 MG tablet Take 500 mg by mouth every 8 (eight) hours as needed for mild pain.    . benztropine (COGENTIN) 0.5 MG tablet Take 0.5 mg by mouth daily.    . chlorproMAZINE (THORAZINE) 100 MG tablet Take 100 mg by mouth at bedtime.    . clonazePAM (KLONOPIN) 0.5 MG tablet Take 0.5 mg by mouth at bedtime.    . docusate sodium (COLACE) 100 MG capsule Take 100 mg by mouth 2 (two) times daily.    . fluPHENAZine (PROLIXIN) 10 MG tablet Take 10 mg by mouth at bedtime.     . fluticasone (FLONASE) 50 MCG/ACT nasal spray Place 1 spray into both nostrils daily.    Marland Kitchen gemfibrozil (LOPID) 600 MG tablet Take 600 mg by mouth daily.    Marland Kitchen levothyroxine (SYNTHROID, LEVOTHROID) 25 MCG tablet Take 12.5 mcg by mouth daily before breakfast.    . Lurasidone HCl (LATUDA) 120 MG TABS Take 120 mg by mouth daily.    . meloxicam (MOBIC) 7.5 MG tablet Take 7.5 mg by mouth daily as needed for pain.     . metoprolol tartrate (LOPRESSOR) 25 MG tablet Take 25 mg by mouth 2 (two) times daily.    . Multiple Vitamin (MULTIVITAMIN WITH MINERALS) TABS tablet Take 1 tablet by mouth daily.    Marland Kitchen omeprazole (PRILOSEC) 20 MG capsule Take 20 mg by mouth daily.    . tamsulosin (FLOMAX) 0.4 MG CAPS capsule Take 0.4 mg by mouth 2 (two) times daily.    . traMADol (ULTRAM) 50 MG tablet Take 50 mg by mouth 2 (two) times daily.    Marland Kitchen venlafaxine XR (EFFEXOR-XR) 150 MG 24 hr capsule Take 150 mg by mouth daily with  breakfast.      Musculoskeletal: Strength & Muscle Tone: within normal limits Gait & Station: normal Patient leans: N/A  Psychiatric Specialty Exam: Physical Exam  Constitutional: He appears well-developed and well-nourished.  HENT:  Head: Normocephalic.  Neck: Normal range of motion.  Respiratory: Effort normal.  Musculoskeletal: Normal range of motion.  Neurological: He is alert.  Psychiatric: His affect is blunt. He is withdrawn. He is noncommunicative.    ROS  Blood pressure (!) 155/80, pulse 89, temperature 98.9 F (37.2 C), temperature source Oral, resp. rate 18, SpO2 99 %.There is no height or weight on file to calculate BMI.  General Appearance: Casual  Eye Contact:  Poor  Speech:  Non communicative  Volume:  UTA non communicative  Mood:  UTA non communicative  Affect:  Constricted and Restricted  Thought Process:  Disorganized  Orientation:  Other:  UTA non communicative  Thought Content:  UTA non communicative  Suicidal Thoughts:  UTA non communicative  Homicidal Thoughts:  UTA non communicative  Memory:  UTA non communicative  Judgement:  Other:  UTA non communicative  Insight:  UTA non communicative  Psychomotor Activity:  UTA non communicative  Concentration:  Concentration: UTA non communicative and Attention Span: UTA non communicative  Recall:  UTA non communicative  Fund of Knowledge:  UTA non communicative  Language:  UTA non communicative  Akathisia:  No  Handed:  Right  AIMS (if indicated):     Assets:  Others:  UTA non communicative  ADL's:  Impaired  Cognition:  Impaired,  Severe  Sleep:        Treatment Plan Summary: Daily contact with patient to assess and evaluate symptoms and progress in treatment, Medication management and Plan Psychosis  Crisis Stabilization -Continue Cogentin 0.5 mg QD, Thorazine 100 mg QHS, Lorazepam 1 mg TID, Latuda 120 mg QD,   Disposition: Recommend psychiatric Inpatient admission when medically  cleared.  Ethelene Hal, NP 05/17/2017 3:00 PM  Patient seen face-to-face for psychiatric evaluation, chart reviewed and case discussed with the physician extender and developed treatment plan. Reviewed the information documented and agree with the treatment plan. Corena Pilgrim, MD

## 2017-05-18 DIAGNOSIS — F200 Paranoid schizophrenia: Secondary | ICD-10-CM | POA: Diagnosis not present

## 2017-05-18 LAB — I-STAT CHEM 8, ED
BUN: 19 mg/dL (ref 6–20)
CALCIUM ION: 1.2 mmol/L (ref 1.15–1.40)
Chloride: 112 mmol/L — ABNORMAL HIGH (ref 101–111)
Creatinine, Ser: 0.9 mg/dL (ref 0.61–1.24)
GLUCOSE: 87 mg/dL (ref 65–99)
HCT: 40 % (ref 39.0–52.0)
Hemoglobin: 13.6 g/dL (ref 13.0–17.0)
Potassium: 3.6 mmol/L (ref 3.5–5.1)
SODIUM: 148 mmol/L — AB (ref 135–145)
TCO2: 18 mmol/L (ref 0–100)

## 2017-05-18 MED ORDER — KCL IN DEXTROSE-NACL 20-5-0.45 MEQ/L-%-% IV SOLN
Freq: Once | INTRAVENOUS | Status: AC
  Administered 2017-05-18: 12:00:00 via INTRAVENOUS
  Filled 2017-05-18: qty 1000

## 2017-05-18 MED ORDER — DEXTROSE-NACL 5-0.45 % IV SOLN
INTRAVENOUS | Status: DC
  Administered 2017-05-18: 125 mL/h via INTRAVENOUS
  Administered 2017-05-18: 11:00:00 via INTRAVENOUS

## 2017-05-18 MED ORDER — FLUPHENAZINE HCL 5 MG PO TABS
5.0000 mg | ORAL_TABLET | Freq: Every day | ORAL | Status: DC
  Administered 2017-05-20: 5 mg via ORAL
  Filled 2017-05-18 (×4): qty 1

## 2017-05-18 MED ORDER — LURASIDONE HCL 40 MG PO TABS
120.0000 mg | ORAL_TABLET | Freq: Every day | ORAL | Status: DC
  Filled 2017-05-18 (×3): qty 3

## 2017-05-18 NOTE — ED Notes (Addendum)
Pt now laughing loudly and inappropriately.

## 2017-05-18 NOTE — ED Notes (Signed)
Pt offered lunch and refused to eat or drink.

## 2017-05-18 NOTE — ED Notes (Signed)
Per night shift, patient still not eating or taking fluids or taking medications.  Pulled his IV out at some point within the last two days.

## 2017-05-18 NOTE — ED Notes (Signed)
Small condom cath applied without difficulty.

## 2017-05-18 NOTE — BH Assessment (Signed)
BHH Assessment Progress Note  This Clinical research associatewriter called the [facility] and spoke to [place]ela.  She is uncertain whether they will have beds available today, but agrees to call me back.  I then called th [facility] and spoke to April [phone]  At the time of the call she too is uncertain about bed availability, but later she calls back reporting that they will have beds.  Referral information has been faxed to her at [phone], and at 10:45 she acknowledges receipt.  As of this writing, a final decision is pending.  Doylene Canninghomas Dani Danis, MA Triage Specialist [phone]

## 2017-05-18 NOTE — ED Notes (Signed)
Patient ambulated with assistance and verbal cues in room to restroom, patient cleaned and bed linen changed

## 2017-05-18 NOTE — ED Notes (Signed)
Sitter offered patient food and fluids.  Patient continues to refuse food and fluids.  IV infusing without difficulty.

## 2017-05-18 NOTE — ED Notes (Signed)
IVF infusing without difficulty.

## 2017-05-18 NOTE — BH Assessment (Signed)
05/18/2017 Reassessment:   Dr. Jannifer FranklinAkintayo and Fransisca KaufmannLaura Davis, NP, requested this writer to reassess patient. He is a 51 yr old male [facility]. He presented with Paranoid Schizophrenia and acute psychosis. Writer tried to reassess patient. He would not respond to verbal stimuli. Patient laying in the bed staring ahead of him. No eye contact made. Writer unable to ascertain if patient has suicidal or homicidal thoughts. Unable to determine details related to psychotic symptoms other than his visual presentation. Per nursing staff patient is refusing to eat or drink. Per Dr. Jannifer FranklinAkintayo and Elta GuadeloupeLaurie Parks, NP, patient continues to meet inpatient criteria as appropriate bed placement is investigated. [facility]. Referral information has been faxed to  at [phone], and at 10:45. [facility] is reviewing.

## 2017-05-18 NOTE — ED Provider Notes (Signed)
Told by RN patient not eating or drinking for the past few days. On exam patient is lying in bed. Laughing inappropriately. IV maintenance fluid ordered at D5 half-normal saline at 125 mL per hour Results for orders placed or performed during the hospital encounter of 05/13/17  CBC  Result Value Ref Range   WBC 13.9 (H) 4.0 - 10.5 K/uL   RBC 5.05 4.22 - 5.81 MIL/uL   Hemoglobin 15.3 13.0 - 17.0 g/dL   HCT 16.143.7 09.639.0 - 04.552.0 %   MCV 86.5 78.0 - 100.0 fL   MCH 30.3 26.0 - 34.0 pg   MCHC 35.0 30.0 - 36.0 g/dL   RDW 40.914.3 81.111.5 - 91.415.5 %   Platelets 334 150 - 400 K/uL  Comprehensive metabolic panel  Result Value Ref Range   Sodium 143 135 - 145 mmol/L   Potassium 3.7 3.5 - 5.1 mmol/L   Chloride 108 101 - 111 mmol/L   CO2 22 22 - 32 mmol/L   Glucose, Bld 98 65 - 99 mg/dL   BUN 18 6 - 20 mg/dL   Creatinine, Ser 7.821.21 0.61 - 1.24 mg/dL   Calcium 9.7 8.9 - 95.610.3 mg/dL   Total Protein 8.7 (H) 6.5 - 8.1 g/dL   Albumin 4.5 3.5 - 5.0 g/dL   AST 49 (H) 15 - 41 U/L   ALT 29 17 - 63 U/L   Alkaline Phosphatase 75 38 - 126 U/L   Total Bilirubin 0.8 0.3 - 1.2 mg/dL   GFR calc non Af Amer >60 >60 mL/min   GFR calc Af Amer >60 >60 mL/min   Anion gap 13 5 - 15  Ethanol  Result Value Ref Range   Alcohol, Ethyl (B) <5 <5 mg/dL  CBC with Differential/Platelet  Result Value Ref Range   WBC 12.8 (H) 4.0 - 10.5 K/uL   RBC 4.53 4.22 - 5.81 MIL/uL   Hemoglobin 13.8 13.0 - 17.0 g/dL   HCT 21.339.9 08.639.0 - 57.852.0 %   MCV 88.1 78.0 - 100.0 fL   MCH 30.5 26.0 - 34.0 pg   MCHC 34.6 30.0 - 36.0 g/dL   RDW 46.914.4 62.911.5 - 52.815.5 %   Platelets 274 150 - 400 K/uL   Neutrophils Relative % 68 %   Neutro Abs 8.7 (H) 1.7 - 7.7 K/uL   Lymphocytes Relative 22 %   Lymphs Abs 2.8 0.7 - 4.0 K/uL   Monocytes Relative 9 %   Monocytes Absolute 1.1 (H) 0.1 - 1.0 K/uL   Eosinophils Relative 1 %   Eosinophils Absolute 0.2 0.0 - 0.7 K/uL   Basophils Relative 0 %   Basophils Absolute 0.0 0.0 - 0.1 K/uL  Basic metabolic panel  Result  Value Ref Range   Sodium 144 135 - 145 mmol/L   Potassium 3.8 3.5 - 5.1 mmol/L   Chloride 111 101 - 111 mmol/L   CO2 20 (L) 22 - 32 mmol/L   Glucose, Bld 86 65 - 99 mg/dL   BUN 15 6 - 20 mg/dL   Creatinine, Ser 4.131.00 0.61 - 1.24 mg/dL   Calcium 9.0 8.9 - 24.410.3 mg/dL   GFR calc non Af Amer >60 >60 mL/min   GFR calc Af Amer >60 >60 mL/min   Anion gap 13 5 - 15  I-stat chem 8, ed  Result Value Ref Range   Sodium 148 (H) 135 - 145 mmol/L   Potassium 3.6 3.5 - 5.1 mmol/L   Chloride 112 (H) 101 - 111 mmol/L   BUN  19 6 - 20 mg/dL   Creatinine, Ser 1.61 0.61 - 1.24 mg/dL   Glucose, Bld 87 65 - 99 mg/dL   Calcium, Ion 0.96 1.15 - 1.40 mmol/L   TCO2 18 0 - 100 mmol/L   Hemoglobin 13.6 13.0 - 17.0 g/dL   HCT 04.5 40.9 - 81.1 %   Dg Chest 2 View  Result Date: 05/12/2017 CLINICAL DATA:  Altered mental status and weakness. EXAM: CHEST  2 VIEW COMPARISON:  December 16, 2008 FINDINGS: The heart size and mediastinal contours are within normal limits. There is no focal infiltrate or pulmonary edema. Probable small left pleural effusion is identified. The visualized skeletal structures are unremarkable. IMPRESSION: No focal pneumonia.  Probable small left pleural effusion. Electronically Signed   By: Sherian Rein M.D.   On: 05/12/2017 14:58   Ct Head Wo Contrast  Result Date: 05/11/2017 CLINICAL DATA:  Altered mental status EXAM: CT HEAD WITHOUT CONTRAST TECHNIQUE: Contiguous axial images were obtained from the base of the skull through the vertex without intravenous contrast. COMPARISON:  None. FINDINGS: Brain: No mass lesion, intraparenchymal hemorrhage or extra-axial collection. No evidence of acute cortical infarct. Brain parenchyma and CSF-containing spaces are normal for age. Vascular: No hyperdense vessel or unexpected calcification. Skull: Normal visualized skull base, calvarium and extracranial soft tissues. Sinuses/Orbits: No sinus fluid levels or advanced mucosal thickening. No mastoid effusion.  Normal orbits. IMPRESSION: Normal head CT. Electronically Signed   By: Deatra Robinson M.D.   On: 05/11/2017 17:03     Doug Sou, MD 05/18/17 1036

## 2017-05-19 DIAGNOSIS — F29 Unspecified psychosis not due to a substance or known physiological condition: Secondary | ICD-10-CM | POA: Diagnosis present

## 2017-05-19 DIAGNOSIS — F200 Paranoid schizophrenia: Secondary | ICD-10-CM | POA: Diagnosis not present

## 2017-05-19 LAB — CBC WITH DIFFERENTIAL/PLATELET
Basophils Absolute: 0 10*3/uL (ref 0.0–0.1)
Basophils Relative: 0 %
EOS ABS: 0.4 10*3/uL (ref 0.0–0.7)
EOS PCT: 3 %
HCT: 37.9 % — ABNORMAL LOW (ref 39.0–52.0)
Hemoglobin: 13.5 g/dL (ref 13.0–17.0)
LYMPHS ABS: 3.9 10*3/uL (ref 0.7–4.0)
Lymphocytes Relative: 35 %
MCH: 29.8 pg (ref 26.0–34.0)
MCHC: 35.6 g/dL (ref 30.0–36.0)
MCV: 83.7 fL (ref 78.0–100.0)
MONOS PCT: 7 %
Monocytes Absolute: 0.7 10*3/uL (ref 0.1–1.0)
Neutro Abs: 6.2 10*3/uL (ref 1.7–7.7)
Neutrophils Relative %: 55 %
PLATELETS: 305 10*3/uL (ref 150–400)
RBC: 4.53 MIL/uL (ref 4.22–5.81)
RDW: 13.9 % (ref 11.5–15.5)
WBC: 11.3 10*3/uL — ABNORMAL HIGH (ref 4.0–10.5)

## 2017-05-19 LAB — COMPREHENSIVE METABOLIC PANEL
ALBUMIN: 3.4 g/dL — AB (ref 3.5–5.0)
ALT: 37 U/L (ref 17–63)
AST: 33 U/L (ref 15–41)
Alkaline Phosphatase: 68 U/L (ref 38–126)
Anion gap: 10 (ref 5–15)
BUN: 17 mg/dL (ref 6–20)
CHLORIDE: 112 mmol/L — AB (ref 101–111)
CO2: 20 mmol/L — AB (ref 22–32)
CREATININE: 0.94 mg/dL (ref 0.61–1.24)
Calcium: 9 mg/dL (ref 8.9–10.3)
GFR calc Af Amer: >60 mL/min (ref >60)
GFR calc non Af Amer: >60 mL/min (ref >60)
GLUCOSE: 101 mg/dL — AB (ref 65–99)
Potassium: 3 mmol/L — ABNORMAL LOW (ref 3.5–5.1)
SODIUM: 142 mmol/L (ref 135–145)
Total Bilirubin: 1 mg/dL (ref 0.3–1.2)
Total Protein: 7.2 g/dL (ref 6.5–8.1)

## 2017-05-19 MED ORDER — OLANZAPINE 10 MG IM SOLR
5.0000 mg | Freq: Once | INTRAMUSCULAR | Status: AC
  Administered 2017-05-19: 5 mg via INTRAMUSCULAR
  Filled 2017-05-19: qty 10

## 2017-05-19 MED ORDER — STERILE WATER FOR INJECTION IJ SOLN
INTRAMUSCULAR | Status: AC
  Administered 2017-05-19: 2.1 mL
  Filled 2017-05-19: qty 10

## 2017-05-19 NOTE — ED Notes (Signed)
Urine specimen requested 

## 2017-05-19 NOTE — ED Notes (Signed)
Pt ate 2 bites of applesauce and feed them to himself

## 2017-05-19 NOTE — ED Notes (Signed)
Pt refusing medication and fluids; pt stating disorganized words, randomly laughs extremely loud off and on

## 2017-05-19 NOTE — ED Notes (Signed)
Pt remains non-verbal.  Pt was informed he was going to receive an injection in his left thigh, pt tensed left thigh.

## 2017-05-19 NOTE — ED Provider Notes (Signed)
Pt refusing to take anything by mouth, refusing to get out of bed to use the bathroom, not following commands.  He is in no distress in ED, alert, nonverbal.  Moves all four extremities weakly but spontaneously, poor eye contact.  Given worsening condition plan to check repeat labs, UA.     Tilden Fossaees, Denym Christenberry, MD 05/19/17 [phone]

## 2017-05-19 NOTE — ED Notes (Addendum)
The D5 and 0.45 NS was the running infusion that was stopped at this time d/t completion.

## 2017-05-19 NOTE — Consult Note (Signed)
[facility]iatry Consult   Reason for Consult:  Paranoid Schizophrenia Referring Physician:  EDP Patient Identification: [name] MRN:  [id number] Principal Diagnosis: Paranoid schizophrenia [facility]) Diagnosis:   Patient Active Problem List   Diagnosis Date Noted  . Psychosis [F29]   . Paranoid schizophrenia ([place]) [F20.0] 05/12/2017    Total Time spent with patient: 30 minutes  Subjective:   [name] is a 51 y.o. male patient admitted from [facility] due to Pt stopped eating, drinking, and taking medications.  HPI: [name] is a 51 year old male who presented to the [facility] due to he stopped eating, drinking, and taking his medications. Pt has been non communicative since admission and continues to refuse food, liquids, and medications. Pt did say a few words today which were nothing but word salad. Pt states "Why Why me, [name], Just keep the happy meals coming." pt was unable to participate in the assessment. Pt requires psychiatric inpatient admission when medically cleared.   Past Psychiatric History: Psychosis, Schizophrenia  Risk to Self: None Risk to Others: None Prior Inpatient Therapy: Prior Inpatient Therapy: Yes Prior Therapy Dates: Unknown Prior Therapy Facilty/Provider(s): [facility] Reason for Treatment: Unknown Prior Outpatient Therapy: Prior Outpatient Therapy: Yes Prior Therapy Dates: Current  Prior Therapy Facilty/Provider(s): [facility] Reason for Treatment: [facility] Does patient have an ACCT team?: No Does patient have Intensive In-House Services?  : No Does patient have [facility] services? : No Does patient have P4CC services?: No  Past Medical History:  Past Medical History:  Diagnosis Date  . Constipation   . Intellectual disability   . Major depressive disorder   . Mental and behavioral problems with communication (including speech)   . Narcolepsy   . Psychosis   . Schizophrenia ([place])   . Thyroid  disease    History reviewed. No pertinent surgical history. Family History: No family history on file. Family Psychiatric  History: Unknown Social History:  History  Alcohol use Not on file     History  Drug use: Unknown    Social History   Social History  . Marital status: Single    Spouse name: N/A  . Number of children: N/A  . Years of education: N/A   Social History Main Topics  . Smoking status: Unknown If Ever Smoked  . Smokeless tobacco: None  . Alcohol use None  . Drug use: Unknown  . Sexual activity: Not Asked   Other Topics Concern  . None   Social History Narrative  . None   Additional Social History:    Allergies:  No Known Allergies  Labs:  Results for orders placed or performed during the hospital encounter of 05/13/17 (from the past 48 hour(s))  I-stat chem 8, ed     Status: Abnormal   Collection Time: 05/18/17 10:30 AM  Result Value Ref Range   Sodium 148 (H) 135 - 145 mmol/L   Potassium 3.6 3.5 - 5.1 mmol/L   Chloride 112 (H) 101 - 111 mmol/L   BUN 19 6 - 20 mg/dL   Creatinine, Ser 0.90 0.61 - 1.24 mg/dL   Glucose, Bld 87 65 - 99 mg/dL   Calcium, Ion 1.20 1.15 - 1.40 mmol/L   TCO2 18 0 - 100 mmol/L   Hemoglobin 13.6 13.0 - 17.0 g/dL   HCT 40.0 39.0 - 52.0 %  Comprehensive metabolic panel     Status: Abnormal   Collection Time: 05/18/17 10:27 PM  Result Value Ref Range  Sodium 142 135 - 145 mmol/L   Potassium 3.0 (L) 3.5 - 5.1 mmol/L   Chloride 112 (H) 101 - 111 mmol/L   CO2 20 (L) 22 - 32 mmol/L   Glucose, Bld 101 (H) 65 - 99 mg/dL   BUN 17 6 - 20 mg/dL   Creatinine, Ser 0.94 0.61 - 1.24 mg/dL   Calcium 9.0 8.9 - 10.3 mg/dL   Total Protein 7.2 6.5 - 8.1 g/dL   Albumin 3.4 (L) 3.5 - 5.0 g/dL   AST 33 15 - 41 U/L   ALT 37 17 - 63 U/L   Alkaline Phosphatase 68 38 - 126 U/L   Total Bilirubin 1.0 0.3 - 1.2 mg/dL   GFR calc non Af Amer >60 >60 mL/min   GFR calc Af Amer >60 >60 mL/min    Comment: (NOTE) The eGFR has been calculated  using the CKD EPI equation. This calculation has not been validated in all clinical situations. eGFR's persistently <60 mL/min signify possible Chronic Kidney Disease.    Anion gap 10 5 - 15  CBC with Differential     Status: Abnormal   Collection Time: 05/18/17 10:27 PM  Result Value Ref Range   WBC 11.3 (H) 4.0 - 10.5 K/uL   RBC 4.53 4.22 - 5.81 MIL/uL   Hemoglobin 13.5 13.0 - 17.0 g/dL   HCT 37.9 (L) 39.0 - 52.0 %   MCV 83.7 78.0 - 100.0 fL   MCH 29.8 26.0 - 34.0 pg   MCHC 35.6 30.0 - 36.0 g/dL   RDW 13.9 11.5 - 15.5 %   Platelets 305 150 - 400 K/uL   Neutrophils Relative % 55 %   Neutro Abs 6.2 1.7 - 7.7 K/uL   Lymphocytes Relative 35 %   Lymphs Abs 3.9 0.7 - 4.0 K/uL   Monocytes Relative 7 %   Monocytes Absolute 0.7 0.1 - 1.0 K/uL   Eosinophils Relative 3 %   Eosinophils Absolute 0.4 0.0 - 0.7 K/uL   Basophils Relative 0 %   Basophils Absolute 0.0 0.0 - 0.1 K/uL    Current Facility-Administered Medications  Medication Dose Route Frequency Provider Last Rate Last Dose  . acetaminophen (TYLENOL) tablet 650 mg  650 mg Oral Q4H PRN Jola Schmidt, MD      . alum & mag hydroxide-simeth (MAALOX/MYLANTA) 200-200-20 MG/5ML suspension 30 mL  30 mL Oral Q6H PRN Jola Schmidt, MD      . benztropine (COGENTIN) tablet 0.5 mg  0.5 mg Oral Daily Jola Schmidt, MD   0.5 mg at 05/13/17 2021  . dextrose 5 %-0.45 % sodium chloride infusion   Intravenous Continuous Orlie Dakin, MD   Stopped at 05/18/17 1918  . docusate sodium (COLACE) capsule 100 mg  100 mg Oral BID Jola Schmidt, MD   100 mg at 05/14/17 0000  . fluPHENAZine (PROLIXIN) tablet 5 mg  5 mg Oral Daily Jeffry Vogelsang, MD      . ibuprofen (ADVIL,MOTRIN) tablet 600 mg  600 mg Oral Q8H PRN Jola Schmidt, MD      . levothyroxine (SYNTHROID, LEVOTHROID) tablet 12.5 mcg  12.5 mcg Oral QAC breakfast Jola Schmidt, MD      . LORazepam (ATIVAN) tablet 1 mg  1 mg Oral TID Akia Montalban, MD      . lurasidone (LATUDA) tablet 120 mg   120 mg Oral QHS Alberto Pina, MD      . metoprolol tartrate (LOPRESSOR) tablet 25 mg  25 mg Oral BID Jola Schmidt, MD  25 mg at 05/14/17 0002  . ondansetron (ZOFRAN) tablet 4 mg  4 mg Oral Q8H PRN Jola Schmidt, MD      . pantoprazole (PROTONIX) EC tablet 40 mg  40 mg Oral Daily Jola Schmidt, MD      . tamsulosin (FLOMAX) capsule 0.4 mg  0.4 mg Oral BID Jola Schmidt, MD      . traZODone (DESYREL) tablet 50 mg  50 mg Oral QHS Corena Pilgrim, MD       Current Outpatient Prescriptions  Medication Sig Dispense Refill  . acetaminophen (TYLENOL) 500 MG tablet Take 500 mg by mouth every 8 (eight) hours as needed for mild pain.    . benztropine (COGENTIN) 0.5 MG tablet Take 0.5 mg by mouth daily.    . chlorproMAZINE (THORAZINE) 100 MG tablet Take 100 mg by mouth at bedtime.    . clonazePAM (KLONOPIN) 0.5 MG tablet Take 0.5 mg by mouth at bedtime.    . docusate sodium (COLACE) 100 MG capsule Take 100 mg by mouth 2 (two) times daily.    . fluPHENAZine (PROLIXIN) 10 MG tablet Take 10 mg by mouth at bedtime.     . fluticasone (FLONASE) 50 MCG/ACT nasal spray Place 1 spray into both nostrils daily.    Marland Kitchen gemfibrozil (LOPID) 600 MG tablet Take 600 mg by mouth daily.    Marland Kitchen levothyroxine (SYNTHROID, LEVOTHROID) 25 MCG tablet Take 12.5 mcg by mouth daily before breakfast.    . Lurasidone HCl (LATUDA) 120 MG TABS Take 120 mg by mouth daily.    . meloxicam (MOBIC) 7.5 MG tablet Take 7.5 mg by mouth daily as needed for pain.     . metoprolol tartrate (LOPRESSOR) 25 MG tablet Take 25 mg by mouth 2 (two) times daily.    . Multiple Vitamin (MULTIVITAMIN WITH MINERALS) TABS tablet Take 1 tablet by mouth daily.    Marland Kitchen omeprazole (PRILOSEC) 20 MG capsule Take 20 mg by mouth daily.    . tamsulosin (FLOMAX) 0.4 MG CAPS capsule Take 0.4 mg by mouth 2 (two) times daily.    . traMADol (ULTRAM) 50 MG tablet Take 50 mg by mouth 2 (two) times daily.    Marland Kitchen venlafaxine XR (EFFEXOR-XR) 150 MG 24 hr capsule Take 150 mg by  mouth daily with breakfast.      Musculoskeletal: Strength & Muscle Tone: within normal limits Gait & Station: normal Patient leans: N/A  Psychiatric Specialty Exam: Physical Exam  Constitutional: He appears well-developed.  HENT:  Head: Normocephalic.  Respiratory: Effort normal.  Psychiatric: Judgment normal. His affect is blunt. He is withdrawn. Cognition and memory are impaired. He is noncommunicative.    Review of Systems  Psychiatric/Behavioral: Positive for hallucinations (schizophrenia).    Blood pressure 135/77, pulse 86, temperature (!) 100.8 F (38.2 C), temperature source Tympanic, resp. rate 19, SpO2 100 %.There is no height or weight on file to calculate BMI.  General Appearance: Casual  Eye Contact:  Poor  Speech:  Blocked and Garbled  Volume:  Decreased  Mood:  UTA non communicative  Affect:  Constricted and Restricted  Thought Process:  NA  Orientation:  Other:  UTA non communicative  Thought Content:  UTA non communicative  Suicidal Thoughts:  UTA non communicative  Homicidal Thoughts:  UTA non communicative  Memory:  UTA non communicative  Judgement:  Other:  UTA non communicative  Insight:  UTA non communicative  Psychomotor Activity:  UTA non communicative  Concentration:  Concentration: UTA non communicative and Attention Span: UTA non  communicative  Recall:  UTA non communicative  Fund of Knowledge:  UTA non communicative  Language:  UTA non communicative  Akathisia:  UTA non communicative  Handed:  Right  AIMS (if indicated):     Assets:  Others:  UTA non communicative  ADL's:  Impaired  Cognition:  Impaired,  Severe  Sleep:        Treatment Plan Summary: Daily contact with patient to assess and evaluate symptoms and progress in treatment, Medication management and Plan Schizophrenia  Disposition: Recommend psychiatric Inpatient admission when medically cleared. TTS to continue to seek placement  Ethelene Hal, NP 05/19/2017 12:42  PM  Patient seen face-to-face for psychiatric evaluation, chart reviewed and case discussed with the physician extender and developed treatment plan. Reviewed the information documented and agree with the treatment plan. Corena Pilgrim, MD

## 2017-05-19 NOTE — BH Assessment (Signed)
[facility] Assessment Progress Note  Per Thedore MinsMojeed Akintayo, MD, this pt continues to require psychiatric hospitalization at this time.  The following facilities have been contacted to seek placement for this pt, with results as noted:  Beds available, information sent, decision pending[facility]   At capacity:  [facility]   Doylene Canninghomas Sharrod [place], [place] Triage Specialist [phone]

## 2017-05-19 NOTE — BH Assessment (Addendum)
[facility] Assessment Progress Note  At 09:30 this writer called April [facility] to check [facility] status.  Call rolled to voice mail and I left a message.  Return call is pending as of this writing.  Doylene Canninghomas Meagan Ancona, MA Triage Specialist [phone]   Addendum:  April calls back from [facility].  She reports that Glori LuisAnkur Patel, MD [phone] has declined pt for admission.  She reports that no [facility] [place] able to provide treatment for patients that require assistance with ADL's; this constitutes exclusionary criteria for them.  Doylene Canninghomas Rohnan Bartleson, MA Triage Specialist [phone]

## 2017-05-19 NOTE — ED Notes (Signed)
Pt refuses to eat lunch, refuses care , pt has been in bed all day . Had several laughing bursts .

## 2017-05-19 NOTE — ED Notes (Signed)
Dr. Patria Maneampos informed pt's labs have resulted.

## 2017-05-20 DIAGNOSIS — F200 Paranoid schizophrenia: Secondary | ICD-10-CM | POA: Diagnosis not present

## 2017-05-20 DIAGNOSIS — E876 Hypokalemia: Secondary | ICD-10-CM | POA: Diagnosis present

## 2017-05-20 LAB — COMPREHENSIVE METABOLIC PANEL
ALK PHOS: 84 U/L (ref 38–126)
ALT: 39 U/L (ref 17–63)
AST: 31 U/L (ref 15–41)
Albumin: 3.7 g/dL (ref 3.5–5.0)
Anion gap: 12 (ref 5–15)
BUN: 15 mg/dL (ref 6–20)
CALCIUM: 9.3 mg/dL (ref 8.9–10.3)
CO2: 21 mmol/L — ABNORMAL LOW (ref 22–32)
CREATININE: 1.08 mg/dL (ref 0.61–1.24)
Chloride: 109 mmol/L (ref 101–111)
Glucose, Bld: 83 mg/dL (ref 65–99)
Potassium: 3.6 mmol/L (ref 3.5–5.1)
Sodium: 142 mmol/L (ref 135–145)
Total Bilirubin: 0.9 mg/dL (ref 0.3–1.2)
Total Protein: 7.6 g/dL (ref 6.5–8.1)

## 2017-05-20 LAB — URINALYSIS, ROUTINE W REFLEX MICROSCOPIC
Bilirubin Urine: NEGATIVE
GLUCOSE, UA: NEGATIVE mg/dL
HGB URINE DIPSTICK: NEGATIVE
Ketones, ur: 5 mg/dL — AB
Leukocytes, UA: NEGATIVE
Nitrite: NEGATIVE
PH: 5 (ref 5.0–8.0)
Protein, ur: NEGATIVE mg/dL
SPECIFIC GRAVITY, URINE: 1.029 (ref 1.005–1.030)

## 2017-05-20 LAB — CBC WITH DIFFERENTIAL/PLATELET
Basophils Absolute: 0 10*3/uL (ref 0.0–0.1)
Basophils Relative: 0 %
EOS PCT: 3 %
Eosinophils Absolute: 0.3 10*3/uL (ref 0.0–0.7)
HCT: 42.4 % (ref 39.0–52.0)
Hemoglobin: 15.2 g/dL (ref 13.0–17.0)
LYMPHS ABS: 4 10*3/uL (ref 0.7–4.0)
LYMPHS PCT: 37 %
MCH: 30.5 pg (ref 26.0–34.0)
MCHC: 35.8 g/dL (ref 30.0–36.0)
MCV: 85 fL (ref 78.0–100.0)
MONOS PCT: 5 %
Monocytes Absolute: 0.6 10*3/uL (ref 0.1–1.0)
Neutro Abs: 5.7 10*3/uL (ref 1.7–7.7)
Neutrophils Relative %: 55 %
PLATELETS: 343 10*3/uL (ref 150–400)
RBC: 4.99 MIL/uL (ref 4.22–5.81)
RDW: 14 % (ref 11.5–15.5)
WBC: 10.6 10*3/uL — AB (ref 4.0–10.5)

## 2017-05-20 LAB — MAGNESIUM: MAGNESIUM: 1.8 mg/dL (ref 1.7–2.4)

## 2017-05-20 LAB — PHOSPHORUS: PHOSPHORUS: 3.1 mg/dL (ref 2.5–4.6)

## 2017-05-20 MED ORDER — THIAMINE HCL 100 MG/ML IJ SOLN: 100.0000 mg | Freq: Every day | INTRAMUSCULAR | Status: DC

## 2017-05-20 MED ORDER — POTASSIUM CHLORIDE 20 MEQ/15ML (10%) PO SOLN
40.0000 meq | Freq: Once | ORAL | Status: AC
  Administered 2017-05-20: 40 meq via ORAL
  Filled 2017-05-20: qty 30

## 2017-05-20 MED ORDER — POTASSIUM CHLORIDE CRYS ER 20 MEQ PO TBCR: 40.0000 meq | EXTENDED_RELEASE_TABLET | Freq: Once | ORAL | Status: DC

## 2017-05-20 MED ORDER — LORAZEPAM 2 MG/ML IJ SOLN
1.0000 mg | Freq: Once | INTRAMUSCULAR | Status: AC
  Administered 2017-05-20: 1 mg via INTRAMUSCULAR
  Filled 2017-05-20: qty 1

## 2017-05-20 NOTE — ED Notes (Signed)
Pt receiving bed bath.

## 2017-05-20 NOTE — ED Notes (Addendum)
Pt stated "Dr. Reino KentPepper?"  Questioned pt if he wanted a Dr. Reino KentPepper.  Pt shook head yes.  Dr. Reino KentPepper purchased.  Pt stated "Marines?"

## 2017-05-20 NOTE — ED Notes (Signed)
Pt meds mixed in sherbet.  Pt refused to take.

## 2017-05-20 NOTE — ED Notes (Signed)
Pt stated "I want to wrap you like a burrito in pita bread."  Pt extended arms to this Clinical research associatewriter.

## 2017-05-20 NOTE — ED Provider Notes (Signed)
Patient continues to refuse po per nursing.  Labs checked and without significant abnormalities.  Patient requires psychiatric recommendations given psychosis/catatonia etiology of poor intake.  Do not feel he requires IV at this time, and feel that if he continues to refuse po intake, would recheck labs to eval if he meets criteria for inpt med treatment.  For now he requires aggressive psych treatment and continued monitoring of po intake.    Alvira MondaySchlossman, Anntoinette Haefele, MD 05/21/17 [phone]

## 2017-05-20 NOTE — ED Notes (Signed)
Pt drinking Dr. Reino KentPepper with K+ mixed in.

## 2017-05-20 NOTE — ED Notes (Signed)
piv flushed

## 2017-05-20 NOTE — ED Notes (Signed)
Gauze wrapping removed from SL site, flushed with 10 mL NS, blood return ntoed.  10 mL drawn and discarded.

## 2017-05-20 NOTE — BH Assessment (Addendum)
[facility] Assessment Progress Note This Clinical research associatewriter spoke with patient this date to re-assess and evaluate treatment progress. Patient will not respond to this writer's questions, this writer attempted to re-frame questions and utilize open ended questions unsuccessfully. Collateral gathered from staff nurse reports patient has not been compliant with his medication regimen and ate "two bites of applesauce" earlier this date. Staff reports patient has had episodes where he will speak incoherently, otherwise has been selectively mute. Per notes, patient has been non communicative since admission and continues to refuse food, liquids, and medications.  On admnission patient was unable to participate in the assessment. Patient was staffed with Jonnalagadda MD who recommended continued inpatient monitoring as appropriate bed placement is investigated. Patient requires a psychiatric inpatient admission when medically cleared.

## 2017-05-20 NOTE — BH Assessment (Signed)
[facility] Assessment Progress Note  Per Janardhana Jonnalagadda, MD, this pt continues to require psychiatric hospitalization at this time.  The following facilities have been contacted to seek placement for this pt, with results as noted:  Beds available, information sent, decision pending:  [facility]   At capacity:  [facility]    Hazel Wrinkle, MA Triage Specialist [phone]     

## 2017-05-21 DIAGNOSIS — R443 Hallucinations, unspecified: Secondary | ICD-10-CM | POA: Diagnosis not present

## 2017-05-21 DIAGNOSIS — E876 Hypokalemia: Secondary | ICD-10-CM

## 2017-05-21 DIAGNOSIS — F200 Paranoid schizophrenia: Secondary | ICD-10-CM | POA: Diagnosis not present

## 2017-05-21 DIAGNOSIS — F29 Unspecified psychosis not due to a substance or known physiological condition: Secondary | ICD-10-CM | POA: Diagnosis not present

## 2017-05-21 LAB — URINALYSIS, ROUTINE W REFLEX MICROSCOPIC
BILIRUBIN URINE: NEGATIVE
GLUCOSE, UA: NEGATIVE mg/dL
Hgb urine dipstick: NEGATIVE
Ketones, ur: 20 mg/dL — AB
Leukocytes, UA: NEGATIVE
NITRITE: NEGATIVE
PH: 5 (ref 5.0–8.0)
Protein, ur: NEGATIVE mg/dL
SPECIFIC GRAVITY, URINE: 1.031 — AB (ref 1.005–1.030)

## 2017-05-21 MED ORDER — LURASIDONE HCL 40 MG PO TABS
80.0000 mg | ORAL_TABLET | Freq: Every day | ORAL | Status: DC
  Administered 2017-05-23: 80 mg via ORAL
  Filled 2017-05-21 (×4): qty 2

## 2017-05-21 MED ORDER — LORAZEPAM 1 MG PO TABS
1.0000 mg | ORAL_TABLET | Freq: Three times a day (TID) | ORAL | Status: DC
  Administered 2017-05-23: 1 mg via ORAL
  Filled 2017-05-21 (×2): qty 1

## 2017-05-21 MED ORDER — HALOPERIDOL LACTATE 5 MG/ML IJ SOLN
2.0000 mg | Freq: Two times a day (BID) | INTRAMUSCULAR | Status: DC
  Administered 2017-05-21 – 2017-05-22 (×3): 2 mg via INTRAMUSCULAR
  Filled 2017-05-21 (×4): qty 1

## 2017-05-21 MED ORDER — LORAZEPAM 2 MG/ML IJ SOLN
1.0000 mg | Freq: Three times a day (TID) | INTRAMUSCULAR | Status: DC
  Administered 2017-05-21 – 2017-05-23 (×4): 1 mg via INTRAMUSCULAR
  Administered 2017-05-23: 10:00:00 via INTRAMUSCULAR
  Filled 2017-05-21 (×4): qty 1

## 2017-05-21 MED ORDER — HALOPERIDOL 2 MG PO TABS
2.0000 mg | ORAL_TABLET | Freq: Two times a day (BID) | ORAL | Status: DC
  Administered 2017-05-23: 2 mg via ORAL
  Filled 2017-05-21 (×2): qty 1

## 2017-05-21 NOTE — ED Notes (Signed)
Patient still not eating or drinking.  Dr. Erin HearingMessner stopped in to do an assessment.

## 2017-05-21 NOTE — ED Notes (Signed)
Patient talking full sentences during his bath this afternoon.  Making eye contact.  Still refusing food and fluids.  Condom cath removed with 50ml or urine.

## 2017-05-21 NOTE — ED Provider Notes (Signed)
I was asked to evaluate the patient for second opinion on forcing medication.   On my evaluation a patient seems to be responding to external stimuli and has a very depressed affect and will not respond to questioning. Apparently he has been refusing medications, food and liquid for the last 6 days. He has food and drink sitting on his table untouched.   His vital signs are within normal limits. He has no other obvious abnormalities.   I think the patient is likely decompensated psychosis and would benefit from antipsychotic however he will not take them so I agree with the psychiatry team that forced medication is indicated on this patient. Will defer to them for choices of medications.    Marily MemosMesner, Joeann Steppe, MD 05/21/17 [phone]

## 2017-05-21 NOTE — ED Notes (Signed)
Offered patient his medication with some ice cream which he has eaten before.  Patient clamped his mouth shut and would not take the medication with ice cream.  Did not eat any breakfast when offered it by the sitter.

## 2017-05-21 NOTE — ED Notes (Signed)
Pt singing happy birthday to sitter.   Pt refusing fluid offered (Dr. Reino KentPepper).   Pt does follow command when asked to look at you, to sing a song.  Pt stated "[place] was [place]

## 2017-05-21 NOTE — Consult Note (Signed)
[place] [facility] Consult   Reason for Consult:  Paranoid Schizophrenia Referring Physician:  EDP Patient Identification: [name] MRN:  [id number] Principal Diagnosis: Paranoid schizophrenia [facility]) Diagnosis:   Patient Active Problem List   Diagnosis Date Noted  . Hypokalemia [E87.6] 05/20/2017  . Psychosis [F29]   . Paranoid schizophrenia ([place]) [F20.0] 05/12/2017    Total Time spent with patient: 30 minutes  Subjective:   [name] is a 51 y.o. male patient admitted from [facility] due to Pt stopped eating, drinking, and taking medications.  HPI: [name] is a 51 year old male who presented to the [place] from his enrichment center due to he stopped eating, drinking, and taking his medications. Pt has been non communicative since admission and continues to refuse food, liquids, and medications. Pt did say a few words today which were nothing but word salad. Pt states "Why Why me, [name], Just keep the happy meals coming." pt was unable to participate in the assessment. Pt requires psychiatric inpatient admission when medically cleared.  Patient today, remain non communicative.  Dr Darleene Cleaver discussed case with EDP to modify oral meds to be given as IM if patient is unable to take by mouth.  Patient in no acute distress.    Past Psychiatric History: Psychosis, Schizophrenia  Risk to Self: None Risk to Others: None Prior Inpatient Therapy: Prior Inpatient Therapy: Yes Prior Therapy Dates: Unknown Prior Therapy Facilty/Provider(s): [place] Reason for Treatment: Unknown Prior Outpatient Therapy: Prior Outpatient Therapy: Yes Prior Therapy Dates: Current  Prior Therapy Facilty/Provider(s): [facility] Reason for Treatment: [facility] Does patient have an ACCT team?: No Does patient have Intensive In-House Services?  : No Does patient have [facility] services? : No Does patient have P4CC services?: No  Past Medical History:  Past Medical History:  Diagnosis  Date  . Constipation   . Intellectual disability   . Major depressive disorder   . Mental and behavioral problems with communication (including speech)   . Narcolepsy   . Psychosis   . Schizophrenia ([place])   . Thyroid disease    History reviewed. No pertinent surgical history. Family History: No family history on file. Family Psychiatric  History: Unknown Social History:  History  Alcohol use Not on file     History  Drug use: Unknown    Social History   Social History  . Marital status: Single    Spouse name: N/A  . Number of children: N/A  . Years of education: N/A   Social History Main Topics  . Smoking status: Unknown If Ever Smoked  . Smokeless tobacco: None  . Alcohol use None  . Drug use: Unknown  . Sexual activity: Not Asked   Other Topics Concern  . None   Social History Narrative  . None   Additional Social History:    Allergies:  No Known Allergies  Labs:  Results for orders placed or performed during the hospital encounter of 05/13/17 (from the past 48 hour(s))  CBC with Differential     Status: Abnormal   Collection Time: 05/20/17  8:10 PM  Result Value Ref Range   WBC 10.6 (H) 4.0 - 10.5 K/uL   RBC 4.99 4.22 - 5.81 MIL/uL   Hemoglobin 15.2 13.0 - 17.0 g/dL   HCT 42.4 39.0 - 52.0 %   MCV 85.0 78.0 - 100.0 fL   MCH 30.5 26.0 - 34.0 pg   MCHC 35.8 30.0 - 36.0 g/dL   RDW 14.0 11.5 - 15.5 %  Platelets 343 150 - 400 K/uL   Neutrophils Relative % 55 %   Neutro Abs 5.7 1.7 - 7.7 K/uL   Lymphocytes Relative 37 %   Lymphs Abs 4.0 0.7 - 4.0 K/uL   Monocytes Relative 5 %   Monocytes Absolute 0.6 0.1 - 1.0 K/uL   Eosinophils Relative 3 %   Eosinophils Absolute 0.3 0.0 - 0.7 K/uL   Basophils Relative 0 %   Basophils Absolute 0.0 0.0 - 0.1 K/uL  Comprehensive metabolic panel     Status: Abnormal   Collection Time: 05/20/17  8:10 PM  Result Value Ref Range   Sodium 142 135 - 145 mmol/L   Potassium 3.6 3.5 - 5.1 mmol/L   Chloride 109 101 - 111  mmol/L   CO2 21 (L) 22 - 32 mmol/L   Glucose, Bld 83 65 - 99 mg/dL   BUN 15 6 - 20 mg/dL   Creatinine, Ser 1.08 0.61 - 1.24 mg/dL   Calcium 9.3 8.9 - 10.3 mg/dL   Total Protein 7.6 6.5 - 8.1 g/dL   Albumin 3.7 3.5 - 5.0 g/dL   AST 31 15 - 41 U/L   ALT 39 17 - 63 U/L   Alkaline Phosphatase 84 38 - 126 U/L   Total Bilirubin 0.9 0.3 - 1.2 mg/dL   GFR calc non Af Amer >60 >60 mL/min   GFR calc Af Amer >60 >60 mL/min    Comment: (NOTE) The eGFR has been calculated using the CKD EPI equation. This calculation has not been validated in all clinical situations. eGFR's persistently <60 mL/min signify possible Chronic Kidney Disease.    Anion gap 12 5 - 15  Magnesium     Status: None   Collection Time: 05/20/17  8:10 PM  Result Value Ref Range   Magnesium 1.8 1.7 - 2.4 mg/dL  Phosphorus     Status: None   Collection Time: 05/20/17  8:10 PM  Result Value Ref Range   Phosphorus 3.1 2.5 - 4.6 mg/dL  Urinalysis, Routine w reflex microscopic     Status: Abnormal   Collection Time: 05/21/17  6:34 AM  Result Value Ref Range   Color, Urine AMBER (A) YELLOW    Comment: BIOCHEMICALS MAY BE AFFECTED BY COLOR   APPearance HAZY (A) CLEAR   Specific Gravity, Urine 1.031 (H) 1.005 - 1.030   pH 5.0 5.0 - 8.0   Glucose, UA NEGATIVE NEGATIVE mg/dL   Hgb urine dipstick NEGATIVE NEGATIVE   Bilirubin Urine NEGATIVE NEGATIVE   Ketones, ur 20 (A) NEGATIVE mg/dL   Protein, ur NEGATIVE NEGATIVE mg/dL   Nitrite NEGATIVE NEGATIVE   Leukocytes, UA NEGATIVE NEGATIVE    Current Facility-Administered Medications  Medication Dose Route Frequency Provider Last Rate Last Dose  . acetaminophen (TYLENOL) tablet 650 mg  650 mg Oral Q4H PRN Jola Schmidt, MD      . alum & mag hydroxide-simeth (MAALOX/MYLANTA) 200-200-20 MG/5ML suspension 30 mL  30 mL Oral Q6H PRN Jola Schmidt, MD      . benztropine (COGENTIN) tablet 0.5 mg  0.5 mg Oral Daily Jola Schmidt, MD   0.5 mg at 05/20/17 0914  . docusate sodium (COLACE)  capsule 100 mg  100 mg Oral BID Jola Schmidt, MD   100 mg at 05/14/17 0000  . haloperidol (HALDOL) tablet 2 mg  2 mg Oral BID Symphoni Helbling, MD       Or  . haloperidol lactate (HALDOL) injection 2 mg  2 mg Intramuscular BID Corena Pilgrim, MD  2 mg at 05/21/17 1345  . ibuprofen (ADVIL,MOTRIN) tablet 600 mg  600 mg Oral Q8H PRN Jola Schmidt, MD      . levothyroxine (SYNTHROID, LEVOTHROID) tablet 12.5 mcg  12.5 mcg Oral QAC breakfast Jola Schmidt, MD   12.5 mcg at 05/20/17 0914  . LORazepam (ATIVAN) tablet 1 mg  1 mg Oral TID Corena Pilgrim, MD       Or  . LORazepam (ATIVAN) injection 1 mg  1 mg Intramuscular TID Karmah Potocki, MD      . lurasidone (LATUDA) tablet 80 mg  80 mg Oral QHS Angelena Sand, MD      . metoprolol tartrate (LOPRESSOR) tablet 25 mg  25 mg Oral BID Jola Schmidt, MD   25 mg at 05/14/17 0002  . ondansetron (ZOFRAN) tablet 4 mg  4 mg Oral Q8H PRN Jola Schmidt, MD      . pantoprazole (PROTONIX) EC tablet 40 mg  40 mg Oral Daily Jola Schmidt, MD      . tamsulosin (FLOMAX) capsule 0.4 mg  0.4 mg Oral BID Jola Schmidt, MD      . traZODone (DESYREL) tablet 50 mg  50 mg Oral QHS Corena Pilgrim, MD       Current Outpatient Prescriptions  Medication Sig Dispense Refill  . acetaminophen (TYLENOL) 500 MG tablet Take 500 mg by mouth every 8 (eight) hours as needed for mild pain.    . benztropine (COGENTIN) 0.5 MG tablet Take 0.5 mg by mouth daily.    . chlorproMAZINE (THORAZINE) 100 MG tablet Take 100 mg by mouth at bedtime.    . clonazePAM (KLONOPIN) 0.5 MG tablet Take 0.5 mg by mouth at bedtime.    . docusate sodium (COLACE) 100 MG capsule Take 100 mg by mouth 2 (two) times daily.    . fluPHENAZine (PROLIXIN) 10 MG tablet Take 10 mg by mouth at bedtime.     . fluticasone (FLONASE) 50 MCG/ACT nasal spray Place 1 spray into both nostrils daily.    Marland Kitchen gemfibrozil (LOPID) 600 MG tablet Take 600 mg by mouth daily.    Marland Kitchen levothyroxine (SYNTHROID, LEVOTHROID) 25 MCG  tablet Take 12.5 mcg by mouth daily before breakfast.    . Lurasidone HCl (LATUDA) 120 MG TABS Take 120 mg by mouth daily.    . meloxicam (MOBIC) 7.5 MG tablet Take 7.5 mg by mouth daily as needed for pain.     . metoprolol tartrate (LOPRESSOR) 25 MG tablet Take 25 mg by mouth 2 (two) times daily.    . Multiple Vitamin (MULTIVITAMIN WITH MINERALS) TABS tablet Take 1 tablet by mouth daily.    Marland Kitchen omeprazole (PRILOSEC) 20 MG capsule Take 20 mg by mouth daily.    . tamsulosin (FLOMAX) 0.4 MG CAPS capsule Take 0.4 mg by mouth 2 (two) times daily.    . traMADol (ULTRAM) 50 MG tablet Take 50 mg by mouth 2 (two) times daily.    Marland Kitchen venlafaxine XR (EFFEXOR-XR) 150 MG 24 hr capsule Take 150 mg by mouth daily with breakfast.      Musculoskeletal: Strength & Muscle Tone: within normal limits Gait & Station: normal Patient leans: N/A  Psychiatric Specialty Exam: Physical Exam  Nursing note and vitals reviewed. Constitutional: He appears well-developed.  HENT:  Head: Normocephalic.  Respiratory: Effort normal.  Psychiatric: Judgment normal. His affect is blunt. He is withdrawn. Cognition and memory are impaired. He is noncommunicative.    Review of Systems  Psychiatric/Behavioral: Positive for hallucinations (schizophrenia).    Blood pressure Marland Kitchen)  128/97, pulse 96, temperature 98.2 F (36.8 C), temperature source Oral, resp. rate 18, SpO2 97 %.There is no height or weight on file to calculate BMI.  General Appearance: Casual  Eye Contact:  Poor  Speech:  Blocked and Garbled  Volume:  Decreased  Mood:  UTA non communicative  Affect:  Constricted and Restricted  Thought Process:  NA  Orientation:  Other:  UTA non communicative  Thought Content:  UTA non communicative  Suicidal Thoughts:  UTA non communicative  Homicidal Thoughts:  UTA non communicative  Memory:  UTA non communicative  Judgement:  Other:  UTA non communicative  Insight:  UTA non communicative  Psychomotor Activity:  UTA non  communicative  Concentration:  Concentration: UTA non communicative and Attention Span: UTA non communicative  Recall:  UTA non communicative  Fund of Knowledge:  UTA non communicative  Language:  UTA non communicative  Akathisia:  UTA non communicative  Handed:  Right  AIMS (if indicated):     Assets:  Others:  UTA non communicative  ADL's:  Impaired  Cognition:  Impaired,  Severe  Sleep:      Treatment Plan Summary: Paranoid Schizophrenia Daily contact with patient to assess and evaluate symptoms and progress in treatment, Medication management  ED physician consult for second opinion for forced medication administration-Patient refusing to eat, drink or take medications.  Disposition: Recommend psychiatric Inpatient admission when medically cleared.   Janett Labella, NP [facility] 05/21/2017 2:47 PM  Patient seen face-to-face for psychiatric evaluation, chart reviewed and case discussed with the physician extender and developed treatment plan. Reviewed the information documented and agree with the treatment plan. Corena Pilgrim, MD

## 2017-05-21 NOTE — Progress Notes (Signed)
CSW faxed patient's IVC paperwork to magistrate office. CSW contacted and confirmed Magistrate office received IVC paperwork. CSW filed patient's IVC paperwork into IVC logbook.   Celso SickleKimberly Mosetta Ferdinand, LCSWA [facility]  Clinical Social Worker [phone]

## 2017-05-21 NOTE — ED Notes (Signed)
Pt laughing loudly & inappropriately.

## 2017-05-22 DIAGNOSIS — F200 Paranoid schizophrenia: Secondary | ICD-10-CM | POA: Diagnosis not present

## 2017-05-22 DIAGNOSIS — F29 Unspecified psychosis not due to a substance or known physiological condition: Secondary | ICD-10-CM | POA: Diagnosis not present

## 2017-05-22 DIAGNOSIS — E876 Hypokalemia: Secondary | ICD-10-CM | POA: Diagnosis not present

## 2017-05-22 NOTE — ED Notes (Addendum)
Pt fed 1 Malaw[place]urkey sandwich with 2 packs of mustard.  Pt was able to ask for a second pack of mustard.  Pt also consumed 175 mL of Dr. Reino KentPepper.

## 2017-05-22 NOTE — ED Notes (Signed)
Pt able to hold own cup and drink without assistance.  Pt also able to feed self the mac n' cheese.

## 2017-05-22 NOTE — ED Notes (Signed)
Pt up OOB to shower and cooperative with sitters.

## 2017-05-22 NOTE — ED Notes (Addendum)
Pt able to hold drink provided and consume by self.  Pt able to remember this writer's name from previous shift.  Questioned pt his DOB.  Pt stated "my [birth year] is [birth year].  Can you order me something from [employer]?"  Informed pt may have a sandwich.  Pt continues to laugh loudly and inappropriately.  Pt stated "sometimes I just laugh, I can't help it, it comes from no where."

## 2017-05-22 NOTE — ED Notes (Signed)
Attempted to give po meds to patient with ice cream. Pt refused to take meds or eat ice cream.

## 2017-05-22 NOTE — ED Notes (Addendum)
Pt fed 4 oz of applesauce & 7.5 oz of [employer] mac n' cheese without difficulty.

## 2017-05-22 NOTE — Consult Note (Signed)
[place] [facility] Consult   Reason for Consult:  Paranoid, agitation, psychosis Referring Physician:  EDP Patient Identification: [name] MRN:  [id number] Principal Diagnosis: Paranoid schizophrenia [facility]) Diagnosis:   Patient Active Problem List   Diagnosis Date Noted  . Paranoid schizophrenia ([place]) [F20.0] 05/12/2017    Priority: High  . Hypokalemia [E87.6] 05/20/2017  . Psychosis [F29]     Total Time spent with patient: 30 minutes  Subjective:   [name] is a 51 y.o. male patient admitted with psychosis.  HPI:  51 yo male who presented to the ED with agitation and not eating/drinking or taking his medications.  Forced medications were started and he is talking some today and eating ice cream and wanting "Dr. Malachi Bonds."  He is not at his baseline but is improving, inpatient hospitalization being sought.  Past Psychiatric History: schizophrenia  Risk to Self: Suicidal Ideation:  (UTA) Suicidal Intent:  (UTA) Is patient at risk for suicide?:  (UTA) Suicidal Plan?:  (UTA) Access to Means:  (UTA) What has been your use of drugs/alcohol within the last 12 months?: N/A How many times?: 0 Other Self Harm Risks:  (UTA) Triggers for Past Attempts:  (UTA) Intentional Self Injurious Behavior:  (UTA) Risk to Others: Homicidal Ideation:  (UTA) Thoughts of Harm to Others:  (UTA) Current Homicidal Intent:  (UTA) Current Homicidal Plan:  (UTA) Access to Homicidal Means:  (UTA) Identified Victim:  (UTA) History of harm to others?:  (UTA) Assessment of Violence: None Noted Violent Behavior Description:  (UTA) Does patient have access to weapons?: No Criminal Charges Pending?: No Does patient have a court date: No Prior Inpatient Therapy: Prior Inpatient Therapy: Yes Prior Therapy Dates: Unknown Prior Therapy Facilty/Provider(s): [place] Reason for Treatment: Unknown Prior Outpatient Therapy: Prior Outpatient Therapy: Yes Prior Therapy Dates: Current  Prior Therapy  Facilty/Provider(s): [facility] Reason for Treatment: [facility] Does patient have an ACCT team?: No Does patient have Intensive In-House Services?  : No Does patient have [facility] services? : No Does patient have P4CC services?: No  Past Medical History:  Past Medical History:  Diagnosis Date  . Constipation   . Intellectual disability   . Major depressive disorder   . Mental and behavioral problems with communication (including speech)   . Narcolepsy   . Psychosis   . Schizophrenia ([place])   . Thyroid disease    History reviewed. No pertinent surgical history. Family History: No family history on file. Family Psychiatric  History: unknown Social History:  History  Alcohol use Not on file     History  Drug use: Unknown    Social History   Social History  . Marital status: Single    Spouse name: N/A  . Number of children: N/A  . Years of education: N/A   Social History Main Topics  . Smoking status: Unknown If Ever Smoked  . Smokeless tobacco: None  . Alcohol use None  . Drug use: Unknown  . Sexual activity: Not Asked   Other Topics Concern  . None   Social History Narrative  . None   Additional Social History:    Allergies:  No Known Allergies  Labs:  Results for orders placed or performed during the hospital encounter of 05/13/17 (from the past 48 hour(s))  CBC with Differential     Status: Abnormal   Collection Time: 05/20/17  8:10 PM  Result Value Ref Range   WBC 10.6 (H) 4.0 - 10.5 K/uL   RBC 4.99 4.22 - 5.81 MIL/uL  Hemoglobin 15.2 13.0 - 17.0 g/dL   HCT 42.4 39.0 - 52.0 %   MCV 85.0 78.0 - 100.0 fL   MCH 30.5 26.0 - 34.0 pg   MCHC 35.8 30.0 - 36.0 g/dL   RDW 14.0 11.5 - 15.5 %   Platelets 343 150 - 400 K/uL   Neutrophils Relative % 55 %   Neutro Abs 5.7 1.7 - 7.7 K/uL   Lymphocytes Relative 37 %   Lymphs Abs 4.0 0.7 - 4.0 K/uL   Monocytes Relative 5 %   Monocytes Absolute 0.6 0.1 - 1.0 K/uL   Eosinophils Relative 3 %   Eosinophils  Absolute 0.3 0.0 - 0.7 K/uL   Basophils Relative 0 %   Basophils Absolute 0.0 0.0 - 0.1 K/uL  Comprehensive metabolic panel     Status: Abnormal   Collection Time: 05/20/17  8:10 PM  Result Value Ref Range   Sodium 142 135 - 145 mmol/L   Potassium 3.6 3.5 - 5.1 mmol/L   Chloride 109 101 - 111 mmol/L   CO2 21 (L) 22 - 32 mmol/L   Glucose, Bld 83 65 - 99 mg/dL   BUN 15 6 - 20 mg/dL   Creatinine, Ser 1.08 0.61 - 1.24 mg/dL   Calcium 9.3 8.9 - 10.3 mg/dL   Total Protein 7.6 6.5 - 8.1 g/dL   Albumin 3.7 3.5 - 5.0 g/dL   AST 31 15 - 41 U/L   ALT 39 17 - 63 U/L   Alkaline Phosphatase 84 38 - 126 U/L   Total Bilirubin 0.9 0.3 - 1.2 mg/dL   GFR calc non Af Amer >60 >60 mL/min   GFR calc Af Amer >60 >60 mL/min    Comment: (NOTE) The eGFR has been calculated using the CKD EPI equation. This calculation has not been validated in all clinical situations. eGFR's persistently <60 mL/min signify possible Chronic Kidney Disease.    Anion gap 12 5 - 15  Magnesium     Status: None   Collection Time: 05/20/17  8:10 PM  Result Value Ref Range   Magnesium 1.8 1.7 - 2.4 mg/dL  Phosphorus     Status: None   Collection Time: 05/20/17  8:10 PM  Result Value Ref Range   Phosphorus 3.1 2.5 - 4.6 mg/dL  Urinalysis, Routine w reflex microscopic     Status: Abnormal   Collection Time: 05/21/17  6:34 AM  Result Value Ref Range   Color, Urine AMBER (A) YELLOW    Comment: BIOCHEMICALS MAY BE AFFECTED BY COLOR   APPearance HAZY (A) CLEAR   Specific Gravity, Urine 1.031 (H) 1.005 - 1.030   pH 5.0 5.0 - 8.0   Glucose, UA NEGATIVE NEGATIVE mg/dL   Hgb urine dipstick NEGATIVE NEGATIVE   Bilirubin Urine NEGATIVE NEGATIVE   Ketones, ur 20 (A) NEGATIVE mg/dL   Protein, ur NEGATIVE NEGATIVE mg/dL   Nitrite NEGATIVE NEGATIVE   Leukocytes, UA NEGATIVE NEGATIVE    Current Facility-Administered Medications  Medication Dose Route Frequency Provider Last Rate Last Dose  . acetaminophen (TYLENOL) tablet 650 mg   650 mg Oral Q4H PRN Jola Schmidt, MD      . alum & mag hydroxide-simeth (MAALOX/MYLANTA) 200-200-20 MG/5ML suspension 30 mL  30 mL Oral Q6H PRN Jola Schmidt, MD      . benztropine (COGENTIN) tablet 0.5 mg  0.5 mg Oral Daily Jola Schmidt, MD   0.5 mg at 05/20/17 0914  . docusate sodium (COLACE) capsule 100 mg  100 mg Oral BID  Jola Schmidt, MD   100 mg at 05/14/17 0000  . haloperidol (HALDOL) tablet 2 mg  2 mg Oral BID Bracha Frankowski, MD       Or  . haloperidol lactate (HALDOL) injection 2 mg  2 mg Intramuscular BID Darleene Cleaver, Elsey Holts, MD   2 mg at 05/21/17 2245  . ibuprofen (ADVIL,MOTRIN) tablet 600 mg  600 mg Oral Q8H PRN Jola Schmidt, MD      . levothyroxine (SYNTHROID, LEVOTHROID) tablet 12.5 mcg  12.5 mcg Oral QAC breakfast Jola Schmidt, MD   12.5 mcg at 05/20/17 0914  . LORazepam (ATIVAN) tablet 1 mg  1 mg Oral TID Corena Pilgrim, MD       Or  . LORazepam (ATIVAN) injection 1 mg  1 mg Intramuscular TID Corena Pilgrim, MD   1 mg at 05/21/17 2246  . lurasidone (LATUDA) tablet 80 mg  80 mg Oral QHS Jaquel Glassburn, MD      . metoprolol tartrate (LOPRESSOR) tablet 25 mg  25 mg Oral BID Jola Schmidt, MD   25 mg at 05/14/17 0002  . ondansetron (ZOFRAN) tablet 4 mg  4 mg Oral Q8H PRN Jola Schmidt, MD      . pantoprazole (PROTONIX) EC tablet 40 mg  40 mg Oral Daily Jola Schmidt, MD      . tamsulosin (FLOMAX) capsule 0.4 mg  0.4 mg Oral BID Jola Schmidt, MD      . traZODone (DESYREL) tablet 50 mg  50 mg Oral QHS Corena Pilgrim, MD       Current Outpatient Prescriptions  Medication Sig Dispense Refill  . acetaminophen (TYLENOL) 500 MG tablet Take 500 mg by mouth every 8 (eight) hours as needed for mild pain.    . benztropine (COGENTIN) 0.5 MG tablet Take 0.5 mg by mouth daily.    . chlorproMAZINE (THORAZINE) 100 MG tablet Take 100 mg by mouth at bedtime.    . clonazePAM (KLONOPIN) 0.5 MG tablet Take 0.5 mg by mouth at bedtime.    . docusate sodium (COLACE) 100 MG capsule Take 100  mg by mouth 2 (two) times daily.    . fluPHENAZine (PROLIXIN) 10 MG tablet Take 10 mg by mouth at bedtime.     . fluticasone (FLONASE) 50 MCG/ACT nasal spray Place 1 spray into both nostrils daily.    Marland Kitchen gemfibrozil (LOPID) 600 MG tablet Take 600 mg by mouth daily.    Marland Kitchen levothyroxine (SYNTHROID, LEVOTHROID) 25 MCG tablet Take 12.5 mcg by mouth daily before breakfast.    . Lurasidone HCl (LATUDA) 120 MG TABS Take 120 mg by mouth daily.    . meloxicam (MOBIC) 7.5 MG tablet Take 7.5 mg by mouth daily as needed for pain.     . metoprolol tartrate (LOPRESSOR) 25 MG tablet Take 25 mg by mouth 2 (two) times daily.    . Multiple Vitamin (MULTIVITAMIN WITH MINERALS) TABS tablet Take 1 tablet by mouth daily.    Marland Kitchen omeprazole (PRILOSEC) 20 MG capsule Take 20 mg by mouth daily.    . tamsulosin (FLOMAX) 0.4 MG CAPS capsule Take 0.4 mg by mouth 2 (two) times daily.    . traMADol (ULTRAM) 50 MG tablet Take 50 mg by mouth 2 (two) times daily.    Marland Kitchen venlafaxine XR (EFFEXOR-XR) 150 MG 24 hr capsule Take 150 mg by mouth daily with breakfast.      Musculoskeletal: Strength & Muscle Tone: within normal limits Gait & Station: normal Patient leans: N/A  Psychiatric Specialty Exam: Physical Exam  Constitutional:  He appears well-developed and well-nourished.  HENT:  Head: Normocephalic.  Neck: Normal range of motion.  Respiratory: Effort normal.  Musculoskeletal: Normal range of motion.  Neurological: He is alert.  Psychiatric: His speech is normal. His mood appears anxious. He is slowed. Thought content is paranoid. Cognition and memory are impaired. He expresses impulsivity.    Review of Systems  Psychiatric/Behavioral: Positive for memory loss. The patient is nervous/anxious.   All other systems reviewed and are negative.   Blood pressure 123/76, pulse (!) 104, temperature 97.7 F (36.5 C), temperature source Oral, resp. rate 18, SpO2 93 %.There is no height or weight on file to calculate BMI.  General  Appearance: Casual  Eye Contact:  Good  Speech:  Slow, minimal  Volume:  Normal  Mood:  Anxious  Affect:  Blunt  Thought Process:  Coherent and Descriptions of Associations: Loose  Orientation:  Other:  person  Thought Content:  minimal verbal, difficult to evaluate  Suicidal Thoughts:  No  Homicidal Thoughts:  No  Memory:  minimal language, difficult to assess  Judgement:  Impaired  Insight:  Lacking  Psychomotor Activity:  Decreased  Concentration:  Concentration: Fair and Attention Span: Fair  Recall:  Poor  Fund of Knowledge:  unable to assess  Language:  Poor  Akathisia:  No  Handed:  Right  AIMS (if indicated):     Assets:  Housing Leisure Time Physical Health Resilience Social Support  ADL's:  Impaired  Cognition:  Impaired,  Moderate  Sleep:        Treatment Plan Summary: Daily contact with patient to assess and evaluate symptoms and progress in treatment, Medication management and Plan schizophrenia, paranoid:  -Crisis stabilization -Medication management:  Continued Haldol 2 mg BID for psychosis, Cogentin 0.'5mg'$  daily for EPS, Ativan 1 mg TID for catatonia, Latuda 80 mg at bedtime for psychosis, Trazodone 50 mg at bedtime for sleep along with his medical medications -Individual counseling  Disposition: Recommend psychiatric Inpatient admission when medically cleared.  Waylan Boga, NP 05/22/2017 12:07 PM  Patient seen face-to-face for psychiatric evaluation, chart reviewed and case discussed with the physician extender and developed treatment plan. Reviewed the information documented and agree with the treatment plan. Corena Pilgrim, MD

## 2017-05-23 DIAGNOSIS — F200 Paranoid schizophrenia: Secondary | ICD-10-CM | POA: Diagnosis not present

## 2017-05-23 DIAGNOSIS — E876 Hypokalemia: Secondary | ICD-10-CM | POA: Diagnosis not present

## 2017-05-23 DIAGNOSIS — F29 Unspecified psychosis not due to a substance or known physiological condition: Secondary | ICD-10-CM | POA: Diagnosis not present

## 2017-05-23 MED ORDER — HALOPERIDOL DECANOATE 100 MG/ML IM SOLN
100.0000 mg | Freq: Once | INTRAMUSCULAR | Status: AC
  Administered 2017-05-23: 100 mg via INTRAMUSCULAR
  Filled 2017-05-23: qty 1

## 2017-05-23 NOTE — ED Notes (Signed)
Pt ambulated from BR to chair without assistance.

## 2017-05-23 NOTE — Consult Note (Signed)
[facility] Face-to-Face Psychiatry Consult   Reason for Consult:  Paranoid, agitation, psychosis Referring Physician:  EDP Patient Identification: [name] MRN:  [id number] Principal Diagnosis: Paranoid schizophrenia [facility]) Diagnosis:   Patient Active Problem List   Diagnosis Date Noted  . Paranoid schizophrenia (HCC) [F20.0] 05/12/2017    Priority: High  . Hypokalemia [E87.6] 05/20/2017  . Psychosis [F29]     Total Time spent with patient: 30 minutes  Subjective:   [name] is a 51 y.o. male patient admitted with psychosis.  HPI:  51 yo male who presented to the ED with agitation and not eating/drinking or taking his medications.  Forced medications were started and a long term Haldol deconate started today.  He will be re-evaluated in the am for stability to return to his facility as he has been eating, drinking, and talking.  Past Psychiatric History: schizophrenia  Risk to Self: NOne Risk to Others: NOne Prior Inpatient Therapy: Prior Inpatient Therapy: Yes Prior Therapy Dates: Unknown Prior Therapy Facilty/Provider(s): [facility] Reason for Treatment: Unknown Prior Outpatient Therapy: Prior Outpatient Therapy: Yes Prior Therapy Dates: Current  Prior Therapy Facilty/Provider(s): [facility] Reason for Treatment: [facility] Does patient have an ACCT team?: No Does patient have Intensive In-House Services?  : No Does patient have [facility] services? : No Does patient have P4CC services?: No  Past Medical History:  Past Medical History:  Diagnosis Date  . Constipation   . Intellectual disability   . Major depressive disorder   . Mental and behavioral problems with communication (including speech)   . Narcolepsy   . Psychosis   . Schizophrenia (HCC)   . Thyroid disease    History reviewed. No pertinent surgical history. Family History: No family history on file. Family Psychiatric  History: unknown Social History:  History  Alcohol use Not on file     History  Drug  use: Unknown    Social History   Social History  . Marital status: Single    Spouse name: N/A  . Number of children: N/A  . Years of education: N/A   Social History Main Topics  . Smoking status: Unknown If Ever Smoked  . Smokeless tobacco: None  . Alcohol use None  . Drug use: Unknown  . Sexual activity: Not Asked   Other Topics Concern  . None   Social History Narrative  . None   Additional Social History:    Allergies:  No Known Allergies  Labs:  No results found for this or any previous visit (from the past 48 hour(s)).  Current Facility-Administered Medications  Medication Dose Route Frequency Provider Last Rate Last Dose  . acetaminophen (TYLENOL) tablet 650 mg  650 mg Oral Q4H PRN Azalia Bilis, MD      . alum & mag hydroxide-simeth (MAALOX/MYLANTA) 200-200-20 MG/5ML suspension 30 mL  30 mL Oral Q6H PRN Azalia Bilis, MD      . benztropine (COGENTIN) tablet 0.5 mg  0.5 mg Oral Daily Azalia Bilis, MD   0.5 mg at 05/23/17 1030  . docusate sodium (COLACE) capsule 100 mg  100 mg Oral BID Azalia Bilis, MD   100 mg at 05/14/17 0000  . haloperidol (HALDOL) tablet 2 mg  2 mg Oral BID Dylana Shaw, MD       Or  . haloperidol lactate (HALDOL) injection 2 mg  2 mg Intramuscular BID Shaundrea Carrigg, MD   2 mg at 05/22/17 2137  . haloperidol decanoate (HALDOL DECANOATE) 100 MG/ML injection 100 mg  100 mg Intramuscular  Once Charm Rings, NP      . ibuprofen (ADVIL,MOTRIN) tablet 600 mg  600 mg Oral Q8H PRN Azalia Bilis, MD      . levothyroxine (SYNTHROID, LEVOTHROID) tablet 12.5 mcg  12.5 mcg Oral QAC breakfast Azalia Bilis, MD   12.5 mcg at 05/20/17 0914  . LORazepam (ATIVAN) tablet 1 mg  1 mg Oral TID Thedore Mins, MD   Stopped at 05/22/17 1600   Or  . LORazepam (ATIVAN) injection 1 mg  1 mg Intramuscular TID Stephanie Mcglone, MD      . lurasidone (LATUDA) tablet 80 mg  80 mg Oral QHS Lillyauna Jenkinson, MD      . metoprolol tartrate (LOPRESSOR) tablet 25 mg   25 mg Oral BID Azalia Bilis, MD   25 mg at 05/23/17 1030  . ondansetron (ZOFRAN) tablet 4 mg  4 mg Oral Q8H PRN Azalia Bilis, MD      . pantoprazole (PROTONIX) EC tablet 40 mg  40 mg Oral Daily Azalia Bilis, MD   40 mg at 05/23/17 1031  . tamsulosin (FLOMAX) capsule 0.4 mg  0.4 mg Oral BID Azalia Bilis, MD      . traZODone (DESYREL) tablet 50 mg  50 mg Oral QHS Thedore Mins, MD       Current Outpatient Prescriptions  Medication Sig Dispense Refill  . acetaminophen (TYLENOL) 500 MG tablet Take 500 mg by mouth every 8 (eight) hours as needed for mild pain.    . benztropine (COGENTIN) 0.5 MG tablet Take 0.5 mg by mouth daily.    . chlorproMAZINE (THORAZINE) 100 MG tablet Take 100 mg by mouth at bedtime.    . clonazePAM (KLONOPIN) 0.5 MG tablet Take 0.5 mg by mouth at bedtime.    . docusate sodium (COLACE) 100 MG capsule Take 100 mg by mouth 2 (two) times daily.    . fluPHENAZine (PROLIXIN) 10 MG tablet Take 10 mg by mouth at bedtime.     . fluticasone (FLONASE) 50 MCG/ACT nasal spray Place 1 spray into both nostrils daily.    Marland Kitchen gemfibrozil (LOPID) 600 MG tablet Take 600 mg by mouth daily.    Marland Kitchen levothyroxine (SYNTHROID, LEVOTHROID) 25 MCG tablet Take 12.5 mcg by mouth daily before breakfast.    . Lurasidone HCl (LATUDA) 120 MG TABS Take 120 mg by mouth daily.    . meloxicam (MOBIC) 7.5 MG tablet Take 7.5 mg by mouth daily as needed for pain.     . metoprolol tartrate (LOPRESSOR) 25 MG tablet Take 25 mg by mouth 2 (two) times daily.    . Multiple Vitamin (MULTIVITAMIN WITH MINERALS) TABS tablet Take 1 tablet by mouth daily.    Marland Kitchen omeprazole (PRILOSEC) 20 MG capsule Take 20 mg by mouth daily.    . tamsulosin (FLOMAX) 0.4 MG CAPS capsule Take 0.4 mg by mouth 2 (two) times daily.    . traMADol (ULTRAM) 50 MG tablet Take 50 mg by mouth 2 (two) times daily.    Marland Kitchen venlafaxine XR (EFFEXOR-XR) 150 MG 24 hr capsule Take 150 mg by mouth daily with breakfast.      Musculoskeletal: Strength & Muscle  Tone: within normal limits Gait & Station: normal Patient leans: N/A  Psychiatric Specialty Exam: Physical Exam  Constitutional: He appears well-developed and well-nourished.  HENT:  Head: Normocephalic.  Neck: Normal range of motion.  Respiratory: Effort normal.  Musculoskeletal: Normal range of motion.  Neurological: He is alert.  Psychiatric: His speech is normal. His mood appears anxious. He is  slowed. Thought content is paranoid. Cognition and memory are impaired. He expresses impulsivity.    Review of Systems  Psychiatric/Behavioral: Positive for memory loss. The patient is nervous/anxious.   All other systems reviewed and are negative.   Blood pressure 104/69, pulse 82, temperature 98.7 F (37.1 C), temperature source Oral, resp. rate 18, SpO2 96 %.There is no height or weight on file to calculate BMI.  General Appearance: Casual  Eye Contact:  Good  Speech:  Slow, minimal  Volume:  Normal  Mood:  Anxious  Affect:  Blunt  Thought Process:  Coherent and Descriptions of Associations: Loose  Orientation:  Other:  person  Thought Content:  minimal verbal, difficult to evaluate  Suicidal Thoughts:  No  Homicidal Thoughts:  No  Memory:  minimal language, difficult to assess  Judgement:  Impaired  Insight:  Lacking  Psychomotor Activity:  Decreased  Concentration:  Concentration: Fair and Attention Span: Fair  Recall:  Poor  Fund of Knowledge:  unable to assess  Language:  Poor  Akathisia:  No  Handed:  Right  AIMS (if indicated):     Assets:  Housing Leisure Time Physical Health Resilience Social Support  ADL's:  Impaired  Cognition:  Impaired,  Moderate  Sleep:        Treatment Plan Summary: Daily contact with patient to assess and evaluate symptoms and progress in treatment, Medication management and Plan schizophrenia, paranoid:  -Crisis stabilization -Medication management:  Continued Haldol 2 mg BID for psychosis, Cogentin 0.5mg  daily for EPS, Ativan 1  mg TID for catatonia, Latuda 80 mg at bedtime for psychosis, Trazodone 50 mg at bedtime for sleep along with his medical medications.  Started Haldol Deconate 100 mg once  -Individual counseling  Disposition: Recommend psychiatric Inpatient admission when medically cleared.  Nanine MeansLORD, JAMISON, NP 05/23/2017 12:53 PM  Patient seen face-to-face for psychiatric evaluation, chart reviewed and case discussed with the physician extender and developed treatment plan. Reviewed the information documented and agree with the treatment plan. Thedore MinsMojeed Glendon Dunwoody, MD

## 2017-05-23 NOTE — ED Notes (Signed)
Patient ate two spoonfuls of macaroni and cheese and two sandwiches for lunch.  Took shower with assistance.  Patient drank 1/2 bottle of Dr. Reino KentPepper and a few sips of tea.

## 2017-05-24 DIAGNOSIS — E876 Hypokalemia: Secondary | ICD-10-CM | POA: Diagnosis not present

## 2017-05-24 DIAGNOSIS — F200 Paranoid schizophrenia: Secondary | ICD-10-CM | POA: Diagnosis not present

## 2017-05-24 DIAGNOSIS — F29 Unspecified psychosis not due to a substance or known physiological condition: Secondary | ICD-10-CM | POA: Diagnosis not present

## 2017-05-24 MED ORDER — IBUPROFEN 200 MG PO TABS
600.0000 mg | ORAL_TABLET | Freq: Three times a day (TID) | ORAL | Status: DC | PRN
Start: 2017-05-24 — End: 2017-05-24

## 2017-05-24 MED ORDER — BENZTROPINE MESYLATE 0.5 MG PO TABS: 0.5000 mg | ORAL_TABLET | Freq: Every day | ORAL | 0 refills | Status: AC

## 2017-05-24 MED ORDER — TRAZODONE HCL 50 MG PO TABS: 50.0000 mg | ORAL_TABLET | Freq: Every day | ORAL | 0 refills | Status: AC

## 2017-05-24 MED ORDER — LURASIDONE HCL 80 MG PO TABS: 80.0000 mg | ORAL_TABLET | Freq: Every day | ORAL | 0 refills | Status: AC

## 2017-05-24 NOTE — ED Notes (Signed)
[facility]  Was told that the transport person is not there at this time and to call back by 10am.

## 2017-05-24 NOTE — Progress Notes (Signed)
CSW spoke with, Onalee Huaavid at [facility], patient is able to return today. CSW contacted [facility] for transport.   Stacy GardnerErin Sereen Schaff, LCSWA Clinical Social Worker [phone]

## 2017-05-24 NOTE — ED Notes (Signed)
Awaiting final dispo by social work for d/c back to [facility].

## 2017-05-24 NOTE — [facility] Suicide Risk Assessment (Signed)
Suicide Risk Assessment  Discharge Assessment   [facility]   Principal Problem: Paranoid schizophrenia [facility](HCC) Discharge Diagnoses:  Patient Active Problem List   Diagnosis Date Noted  . Paranoid schizophrenia (HCC) [F20.0] 05/12/2017    Priority: High  . Hypokalemia [E87.6] 05/20/2017  . Psychosis [F29]     Total Time spent with patient: 30 minutes  Musculoskeletal: Strength & Muscle Tone: within normal limits Gait & Station: normal Patient leans: N/A  Psychiatric Specialty Exam: Physical Exam  Constitutional: He appears well-developed and well-nourished.  HENT:  Head: Normocephalic.  Neck: Normal range of motion.  Respiratory: Effort normal.  Musculoskeletal: Normal range of motion.  Neurological: He is alert.  Psychiatric: He has a normal mood and affect. His speech is normal and behavior is normal. Judgment and thought content normal. Cognition and memory are normal.    Review of Systems  All other systems reviewed and are negative.   Blood pressure 98/67, pulse 85, temperature 98.5 F (36.9 C), temperature source Axillary, resp. rate 18, SpO2 97 %.There is no height or weight on file to calculate BMI.  General Appearance: Casual  Eye Contact:  Good  Speech: Normal  Volume:  Normal  Mood:  Euthymic  Affect:  Congruent  Thought Process:  WDL  Orientation:  Person and place  Thought Content:  WDL  Suicidal Thoughts:  No  Homicidal Thoughts:  No  Memory:  WDL  Judgement:  Fair  Insight:  FAir  Psychomotor Activity:  Normal  Concentration:  Concentration and attention span:  Good  Recall:  Good  Fund of Knowledge:  Fair  Language:  Good  Akathisia:  No  Handed:  Right  AIMS (if indicated):     Assets:  Housing Leisure Time Physical Health Resilience Social Support  ADL's:  WDL  Cognition:  WDL  Sleep:       Mental Status Per Nursing Assessment::   On Admission:   psychosis  Demographic Factors:  Male  Loss  Factors: NA  Historical Factors: NA  Risk Reduction Factors:   Sense of responsibility to family, Living with another person, especially a relative, Positive social support and Positive therapeutic relationship  Continued Clinical Symptoms:  None  Cognitive Features That Contribute To Risk:  None    Suicide Risk:  Minimal: No identifiable suicidal ideation.  Patients presenting with no risk factors but with morbid ruminations; may be classified as minimal risk based on the severity of the depressive symptoms    Plan Of Care/Follow-up recommendations:  Activity:  as tolerated Diet:  heart healhty diet  LORD, JAMISON, NP 05/24/2017, 9:15 AM

## 2017-05-24 NOTE — BH Assessment (Signed)
[facility] Assessment Progress Note  Per Thedore MinsMojeed Akintayo, MD, this pt does not require psychiatric hospitalization at this time.  [facility]  He has been detain in the ED under IVC renewed by Dr Jannifer FranklinAkintayo on 05/21/2017, which he has now rescinded.  This Clinical research associatewriter will contact Stacy GardnerErin Davenport, LCSWA to facilitate return to [facility].  Pt's nurse has been notified.  Doylene Canninghomas Alixandria Friedt, MA Triage Specialist [phone]

## 2017-05-24 NOTE — ED Notes (Signed)
Called back to the [facility] center and spoke to pt's caregiver, Onalee HuaDavid, there.  Reported off to him and he has asked that pt be transported back by [facility].

## 2017-05-24 NOTE — Consult Note (Signed)
[facility]   Reason for Consult:  Paranoid, agitation, psychosis Referring Physician:  EDP Patient Identification: [name] MRN:  [id number] Principal Diagnosis: Paranoid schizophrenia [facility](HCC) Diagnosis:   Patient Active Problem List   Diagnosis Date Noted  . Paranoid schizophrenia (HCC) [F20.0] 05/12/2017    Priority: High  . Hypokalemia [E87.6] 05/20/2017  . Psychosis [F29]     Total Time spent with patient: 30 minutes  Subjective:   Russell Channelerry Devilla is a 51 y.o. male patient has stabilized.  HPI:  51 yo male who presented to the ED with agitation and not eating/drinking or taking his medications.  Haldol Deconate 100 mg given yesterday.  Patient has been singing and eating without issues.  No suicidal/homicidal ideations, hallucinations, or alcohol/drug abuse.  Stable to return to his facility.  Past Psychiatric History: schizophrenia  Risk to Self: NOne Risk to Others: NOne Prior Inpatient Therapy: Prior Inpatient Therapy: Yes Prior Therapy Dates: Unknown Prior Therapy Facilty/Provider(s): [facility] Reason for Treatment: Unknown Prior Outpatient Therapy: Prior Outpatient Therapy: Yes Prior Therapy Dates: Current  Prior [facility]ilty[facility]): [facility] Reason for Treatment: [facility] Does patient have an ACCT team?: No Does patient have Intensive In-House Services?  : No Does patient have [facility] services? : No Does patient have P4CC services?: No  Past Medical History:  Past Medical History:  Diagnosis Date  . Constipation   . Intellectual disability   . Major depressive disorder   . Mental and behavioral problems with communication (including speech)   . Narcolepsy   . Psychosis   . Schizophrenia (HCC)   . Thyroid disease    History reviewed. No pertinent surgical history. Family History: No family history on file. Family Psychiatric  History: unknown Social History:  History  Alcohol use Not on file     History  Drug use:  Unknown    Social History   Social History  . Marital status: Single    Spouse name: N/A  . Number of children: N/A  . Years of education: N/A   Social History Main Topics  . Smoking status: Unknown If Ever Smoked  . Smokeless tobacco: None  . Alcohol use None  . Drug use: Unknown  . Sexual activity: Not Asked   Other Topics Concern  . None   Social History Narrative  . None   Additional Social History:    Allergies:  No Known Allergies  Labs:  No results found for this or any previous visit (from the past 48 hour(s)).  Current Facility-Administered Medications  Medication Dose Route Frequency Provider Last Rate Last Dose  . acetaminophen (TYLENOL) tablet 650 mg  650 mg Oral Q4H PRN Azalia Bilisampos, Kevin, MD      . alum & mag hydroxide-simeth (MAALOX/MYLANTA) 200-200-20 MG/5ML suspension 30 mL  30 mL Oral Q6H PRN Azalia Bilisampos, Kevin, MD      . benztropine (COGENTIN) tablet 0.5 mg  0.5 mg Oral Daily Azalia Bilisampos, Kevin, MD   0.5 mg at 05/23/17 1030  . docusate sodium (COLACE) capsule 100 mg  100 mg Oral BID Azalia Bilisampos, Kevin, MD   100 mg at 05/23/17 2135  . haloperidol (HALDOL) tablet 2 mg  2 mg Oral BID Hakim Minniefield, MD   2 mg at 05/23/17 2135   Or  . haloperidol lactate (HALDOL) injection 2 mg  2 mg Intramuscular BID Thedore MinsAkintayo, Demetrio Leighty, MD   2 mg at 05/22/17 2137  . ibuprofen (ADVIL,MOTRIN) tablet 600 mg  600 mg Oral Q8H PRN Absher, Ky BarbanRandall K, RPH      .  levothyroxine (SYNTHROID, LEVOTHROID) tablet 12.5 mcg  12.5 mcg Oral QAC breakfast Azalia Bilis, MD   12.5 mcg at 05/20/17 0914  . LORazepam (ATIVAN) tablet 1 mg  1 mg Oral TID Thedore Mins, MD   1 mg at 05/23/17 2135   Or  . LORazepam (ATIVAN) injection 1 mg  1 mg Intramuscular TID Thedore Mins, MD   1 mg at 05/23/17 1645  . lurasidone (LATUDA) tablet 80 mg  80 mg Oral QHS Marnita Poirier, MD   80 mg at 05/23/17 2136  . metoprolol tartrate (LOPRESSOR) tablet 25 mg  25 mg Oral BID Azalia Bilis, MD   25 mg at 05/23/17 2135  .  ondansetron (ZOFRAN) tablet 4 mg  4 mg Oral Q8H PRN Azalia Bilis, MD      . pantoprazole (PROTONIX) EC tablet 40 mg  40 mg Oral Daily Azalia Bilis, MD   40 mg at 05/23/17 1031  . tamsulosin (FLOMAX) capsule 0.4 mg  0.4 mg Oral BID Azalia Bilis, MD   0.4 mg at 05/23/17 2136  . traZODone (DESYREL) tablet 50 mg  50 mg Oral QHS Thedore Mins, MD   50 mg at 05/23/17 2134   Current Outpatient Prescriptions  Medication Sig Dispense Refill  . acetaminophen (TYLENOL) 500 MG tablet Take 500 mg by mouth every 8 (eight) hours as needed for mild pain.    . benztropine (COGENTIN) 0.5 MG tablet Take 0.5 mg by mouth daily.    . chlorproMAZINE (THORAZINE) 100 MG tablet Take 100 mg by mouth at bedtime.    . clonazePAM (KLONOPIN) 0.5 MG tablet Take 0.5 mg by mouth at bedtime.    . docusate sodium (COLACE) 100 MG capsule Take 100 mg by mouth 2 (two) times daily.    . fluPHENAZine (PROLIXIN) 10 MG tablet Take 10 mg by mouth at bedtime.     . fluticasone (FLONASE) 50 MCG/ACT nasal spray Place 1 spray into both nostrils daily.    Marland Kitchen gemfibrozil (LOPID) 600 MG tablet Take 600 mg by mouth daily.    Marland Kitchen levothyroxine (SYNTHROID, LEVOTHROID) 25 MCG tablet Take 12.5 mcg by mouth daily before breakfast.    . Lurasidone HCl (LATUDA) 120 MG TABS Take 120 mg by mouth daily.    . meloxicam (MOBIC) 7.5 MG tablet Take 7.5 mg by mouth daily as needed for pain.     . metoprolol tartrate (LOPRESSOR) 25 MG tablet Take 25 mg by mouth 2 (two) times daily.    . Multiple Vitamin (MULTIVITAMIN WITH MINERALS) TABS tablet Take 1 tablet by mouth daily.    Marland Kitchen omeprazole (PRILOSEC) 20 MG capsule Take 20 mg by mouth daily.    . tamsulosin (FLOMAX) 0.4 MG CAPS capsule Take 0.4 mg by mouth 2 (two) times daily.    . traMADol (ULTRAM) 50 MG tablet Take 50 mg by mouth 2 (two) times daily.    Marland Kitchen venlafaxine XR (EFFEXOR-XR) 150 MG 24 hr capsule Take 150 mg by mouth daily with breakfast.      Musculoskeletal: Strength & Muscle Tone: within  normal limits Gait & Station: normal Patient leans: N/A  Psychiatric Specialty Exam: Physical Exam  Constitutional: He appears well-developed and well-nourished.  HENT:  Head: Normocephalic.  Neck: Normal range of motion.  Respiratory: Effort normal.  Musculoskeletal: Normal range of motion.  Neurological: He is alert.  Psychiatric: He has a normal mood and affect. His speech is normal and behavior is normal. Judgment and thought content normal. Cognition and memory are normal.  Review of Systems  All other systems reviewed and are negative.   Blood pressure 98/67, pulse 85, temperature 98.5 F (36.9 C), temperature source Axillary, resp. rate 18, SpO2 97 %.There is no height or weight on file to calculate BMI.  General Appearance: Casual  Eye Contact:  Good  Speech: Normal  Volume:  Normal  Mood:  Euthymic  Affect:  Congruent  Thought Process:  WDL  Orientation:  Person and place  Thought Content:  WDL  Suicidal Thoughts:  No  Homicidal Thoughts:  No  Memory:  WDL  Judgement:  Fair  Insight:  FAir  Psychomotor Activity:  Normal  Concentration:  Concentration and attention span:  Good  Recall:  Good  Fund of Knowledge:  Fair  Language:  Good  Akathisia:  No  Handed:  Right  AIMS (if indicated):     Assets:  Housing Leisure Time Physical Health Resilience Social Support  ADL's:  WDL  Cognition:  WDL  Sleep:        Treatment Plan Summary: Daily contact with patient to assess and evaluate symptoms and progress in treatment, Medication management and Plan schizophrenia, paranoid:  -Crisis stabilization -Medication management:  Continued Haldol 2 mg BID for psychosis, Cogentin 0.5mg  daily for EPS, Ativan 1 mg TID for catatonia, Latuda 80 mg at bedtime for psychosis, Trazodone 50 mg at bedtime for sleep along with his medical medications.  Haldol Deconate 100 mg given yesterday -Individual counseling  Disposition: Discharge home  Nanine Means, NP 05/24/2017  9:03 AM  Patient seen face-to-face for psychiatric evaluation, chart reviewed and case discussed with the physician extender and developed treatment plan. Reviewed the information documented and agree with the treatment plan. Thedore Mins, MD

## 2017-05-24 NOTE — Progress Notes (Signed)
CSW called and spoke with representative from [facility]. CSW was informed that this is where pt is from and can return to once discharged. Representative had questions regarding if pt had been stabilized and taking medications. CSW  representative that a nurse would call and give report regarding pt's condition and medication. CSW notified RN that pt will be taking [facility] to get back to the facility and could call when ready.    Claude MangesKierra S. Damon Hargrove, MSW, LCSW-A Emergency Department Clinical Social Worker [phone]

## 2018-04-23 ENCOUNTER — Emergency Department (HOSPITAL_COMMUNITY): Payer: Medicare Other

## 2018-04-23 ENCOUNTER — Emergency Department (HOSPITAL_COMMUNITY)
Admission: EM | Admit: 2018-04-23 | Discharge: 2018-04-23 | Disposition: A | Discharge status: Other Acute Inpatient Hospital | Payer: Medicare Other | Attending: Emergency Medicine | Admitting: Emergency Medicine

## 2018-04-23 ENCOUNTER — Encounter (HOSPITAL_COMMUNITY): Payer: Self-pay | Admitting: *Deleted

## 2018-04-23 DIAGNOSIS — F78 Other intellectual disabilities: Secondary | ICD-10-CM | POA: Insufficient documentation

## 2018-04-23 DIAGNOSIS — S39012A Strain of muscle, fascia and tendon of lower back, initial encounter: Secondary | ICD-10-CM

## 2018-04-23 DIAGNOSIS — W01198A Fall on same level from slipping, tripping and stumbling with subsequent striking against other object, initial encounter: Secondary | ICD-10-CM | POA: Diagnosis not present

## 2018-04-23 DIAGNOSIS — S0990XA Unspecified injury of head, initial encounter: Secondary | ICD-10-CM | POA: Diagnosis present

## 2018-04-23 DIAGNOSIS — Y92121 Bathroom in nursing home as the place of occurrence of the external cause: Secondary | ICD-10-CM | POA: Diagnosis not present

## 2018-04-23 DIAGNOSIS — Y9389 Activity, other specified: Secondary | ICD-10-CM | POA: Insufficient documentation

## 2018-04-23 DIAGNOSIS — S0003XA Contusion of scalp, initial encounter: Secondary | ICD-10-CM | POA: Insufficient documentation

## 2018-04-23 DIAGNOSIS — Y999 Unspecified external cause status: Secondary | ICD-10-CM | POA: Diagnosis not present

## 2018-04-23 DIAGNOSIS — S161XXA Strain of muscle, fascia and tendon at neck level, initial encounter: Secondary | ICD-10-CM

## 2018-04-23 DIAGNOSIS — Z79899 Other long term (current) drug therapy: Secondary | ICD-10-CM | POA: Diagnosis not present

## 2018-04-23 DIAGNOSIS — W010XXA Fall on same level from slipping, tripping and stumbling without subsequent striking against object, initial encounter: Secondary | ICD-10-CM

## 2018-04-23 MED ORDER — ACETAMINOPHEN 500 MG PO TABS
1000.0000 mg | ORAL_TABLET | Freq: Once | ORAL | Status: AC
  Administered 2018-04-23: 1000 mg via ORAL
  Filled 2018-04-23: qty 2

## 2018-04-23 NOTE — ED Triage Notes (Signed)
Per EMS, pt from [facility]. Pt complains of head and neck pain. Pt states he slipped and fell in the bathroom last night, hitting his head. Pt is not on blood thinners. Pt is not sure if he lost consciousness. Pt was ambulatory on scene.

## 2018-04-23 NOTE — ED Notes (Signed)
[facility] called for transport.  

## 2018-04-23 NOTE — Discharge Instructions (Addendum)
It was our pleasure to provide your ER care today - we hope that you feel better.  Your xrays/imaging studies were read as showing no acute injury. You may have strained your neck/back muscles.   Take acetaminophen and/or ibuprofen as need for pain.  Follow up with primary care doctor in 1 week if symptoms fail to improve/resolve.

## 2018-04-23 NOTE — ED Notes (Addendum)
Spoke with [name], at [facility], who reports she will call her supervisor to determine how patient will be transferred home. Reports she will call back.

## 2018-04-23 NOTE — ED Notes (Signed)
Per [name], from [facility], supervisor [facility].

## 2018-04-23 NOTE — ED Provider Notes (Addendum)
[facility]-EMERGENCY DEPT Provider Note   CSN: [id number] Arrival date & time: 04/23/18  1723     History   Chief Complaint Chief Complaint  Patient presents with  . Fall  . Head Injury    HPI [name] is a 52 y.o. male.  [facility], hit head, pt unsure of loc. Dull headache since, persistent, moderate. Also c/o neck pain, dull, moderate, non radiating. No associated numbness/weakness. No vomiting. Denies chest pain or sob. No faintness or dizziness prior to fall. Pt indicates he slipped. Denies extremity pain or injury. Skin intact. No lacs/wounds.   The history is provided by the patient and the EMS personnel.  Fall  Associated symptoms include headaches. Pertinent negatives include no chest pain, no abdominal pain and no shortness of breath.  Head Injury   Pertinent negatives include no numbness, no vomiting and no weakness.    Past Medical History:  Diagnosis Date  . Constipation   . Intellectual disability   . Major depressive disorder   . Mental and behavioral problems with communication (including speech)   . Narcolepsy   . Psychosis (HCC)   . Schizophrenia (HCC)   . Thyroid disease     Patient Active Problem List   Diagnosis Date Noted  . Hypokalemia 05/20/2017  . Psychosis (HCC)   . Paranoid schizophrenia (HCC) 05/12/2017    History reviewed. No pertinent surgical history.      Home Medications    Prior to Admission medications   Medication Sig Start Date End Date Taking? Authorizing Provider  acetaminophen (TYLENOL) 500 MG tablet Take 500 mg by mouth every 8 (eight) hours as needed for mild pain.    [provider]  benztropine (COGENTIN) 0.5 MG tablet Take 1 tablet (0.5 mg total) by mouth daily. 05/24/17   Charm Rings, NP  chlorproMAZINE (THORAZINE) 100 MG tablet Take 100 mg by mouth at bedtime.    [provider]  clonazePAM (KLONOPIN) 0.5 MG tablet Take 0.5 mg by mouth at  bedtime.    [provider]  docusate sodium (COLACE) 100 MG capsule Take 100 mg by mouth 2 (two) times daily.    [provider]  fluPHENAZine (PROLIXIN) 10 MG tablet Take 10 mg by mouth at bedtime.     [provider]  fluticasone (FLONASE) 50 MCG/ACT nasal spray Place 1 spray into both nostrils daily.    [provider]  gemfibrozil (LOPID) 600 MG tablet Take 600 mg by mouth daily.    [provider]  levothyroxine (SYNTHROID, LEVOTHROID) 25 MCG tablet Take 12.5 mcg by mouth daily before breakfast.    [provider]  lurasidone (LATUDA) 80 MG TABS tablet Take 1 tablet (80 mg total) by mouth at bedtime. 05/24/17   Charm Rings, NP  Lurasidone HCl (LATUDA) 120 MG TABS Take 120 mg by mouth daily.    [provider]  meloxicam (MOBIC) 7.5 MG tablet Take 7.5 mg by mouth daily as needed for pain.     [provider]  metoprolol tartrate (LOPRESSOR) 25 MG tablet Take 25 mg by mouth 2 (two) times daily.    [provider]  Multiple Vitamin (MULTIVITAMIN WITH MINERALS) TABS tablet Take 1 tablet by mouth daily.    [provider]  omeprazole (PRILOSEC) 20 MG capsule Take 20 mg by mouth daily.    [provider]  tamsulosin (FLOMAX) 0.4 MG CAPS capsule Take 0.4 mg by mouth 2 (two) times  daily.    [provider]  traMADol (ULTRAM) 50 MG tablet Take 50 mg by mouth 2 (two) times daily.    [provider]  traZODone (DESYREL) 50 MG tablet Take 1 tablet (50 mg total) by mouth at bedtime. 05/24/17   Charm Rings, NP  venlafaxine XR (EFFEXOR-XR) 150 MG 24 hr capsule Take 150 mg by mouth daily with breakfast.    [provider]    Family History No family history on file.  Social History Social History   Tobacco Use  . Smoking status: Unknown If Ever Smoked  Substance Use Topics  . Alcohol use: Not on file  . Drug use: Not on file     Allergies   Patient has no known  allergies.   Review of Systems Review of Systems  Constitutional: Negative for fever.  HENT: Negative for sore throat.   Eyes: Negative for redness.  Respiratory: Negative for shortness of breath.   Cardiovascular: Negative for chest pain.  Gastrointestinal: Negative for abdominal pain and vomiting.  Genitourinary: Negative for flank pain.  Musculoskeletal: Positive for neck pain.  Skin: Negative for wound.  Neurological: Positive for headaches. Negative for weakness and numbness.  Hematological: Does not bruise/bleed easily.  Psychiatric/Behavioral: Negative for confusion.     Physical Exam Updated Vital Signs BP 117/80 (BP Location: Left Arm)   Pulse 85   Temp 99.2 F (37.3 C) (Oral)   Resp 18   SpO2 97%   Physical Exam  Constitutional: He appears well-developed and well-nourished. No distress.  HENT:  Mouth/Throat: Oropharynx is clear and moist.  Sts/tenderness posterior scalp.   Eyes: Pupils are equal, round, and reactive to light. Conjunctivae are normal.  Neck: Neck supple. No tracheal deviation present. No thyromegaly present.  No bruits.  Cardiovascular: Normal rate, regular rhythm, normal heart sounds and intact distal pulses. Exam reveals no gallop and no friction rub.  No murmur heard. Pulmonary/Chest: Effort normal and breath sounds normal. No accessory muscle usage. No respiratory distress. He exhibits no tenderness.  Abdominal: Soft. Bowel sounds are normal. He exhibits no distension. There is no tenderness. There is no guarding.  Genitourinary:  Genitourinary Comments: No cva tenderness  Musculoskeletal: He exhibits no edema.  Mid cervical tenderness, otherwise, CTLS spine, non tender, aligned, no step off. Mild lumbar muscular tenderness.  Good rom bil extremities without pain or focal bony tenderness.   Neurological: He is alert.  Speech clear. Motor intact bil, stre 5/5. sens grossly intact.  Steady gait.   Skin: Skin is warm and dry. No rash noted.  He is not diaphoretic.  Psychiatric: He has a normal mood and affect.  Nursing note and vitals reviewed.    ED Treatments / Results  Labs (all labs ordered are listed, but only abnormal results are displayed) Labs Reviewed - No data to display  EKG None  Radiology Ct Head Wo Contrast  Result Date: 04/23/2018 CLINICAL DATA:  Slip and fall in the bathroom last night striking head. Head and neck pain. Under loss of consciousness. EXAM: CT HEAD WITHOUT CONTRAST CT CERVICAL SPINE WITHOUT CONTRAST TECHNIQUE: Multidetector CT imaging of the head and cervical spine was performed following the standard protocol without intravenous contrast. Multiplanar CT image reconstructions of the cervical spine were also generated. COMPARISON:  Head CT 05/11/2017 FINDINGS: CT HEAD FINDINGS Brain: Brain volume is normal for age. No intracranial hemorrhage, mass effect, or midline shift. No hydrocephalus. The basilar cisterns are patent. No evidence of territorial infarct or acute  ischemia. No extra-axial or intracranial fluid collection. Vascular: No hyperdense vessel or unexpected calcification. Skull: No fracture or focal lesion. Sinuses/Orbits: Paranasal sinuses and mastoid air cells are clear. The visualized orbits are unremarkable. Other: None. CT CERVICAL SPINE FINDINGS Alignment: Reversal of normal lordosis, no traumatic subluxation. Skull base and vertebrae: No acute fracture. Vertebral body heights are maintained. The dens and skull base are intact. Soft tissues and spinal canal: No prevertebral fluid or swelling. No visible canal hematoma. Disc levels: Disc space narrowing and endplate spurring at C3-C4, C4-C5, and C5-C6. Upper chest: Negative. Other: None. IMPRESSION: 1. Unremarkable noncontrast head CT. No evidence of acute traumatic injury. 2. No fracture of the cervical spine. Reversal of normal cervical lordosis can be seen with muscle spasm, positioning, or soft tissue injury. Electronically Signed   By:  Rubye OaksMelanie  Ehinger M.D.   On: 04/23/2018 19:04   Ct Cervical Spine Wo Contrast  Result Date: 04/23/2018 CLINICAL DATA:  Slip and fall in the bathroom last night striking head. Head and neck pain. Under loss of consciousness. EXAM: CT HEAD WITHOUT CONTRAST CT CERVICAL SPINE WITHOUT CONTRAST TECHNIQUE: Multidetector CT imaging of the head and cervical spine was performed following the standard protocol without intravenous contrast. Multiplanar CT image reconstructions of the cervical spine were also generated. COMPARISON:  Head CT 05/11/2017 FINDINGS: CT HEAD FINDINGS Brain: Brain volume is normal for age. No intracranial hemorrhage, mass effect, or midline shift. No hydrocephalus. The basilar cisterns are patent. No evidence of territorial infarct or acute ischemia. No extra-axial or intracranial fluid collection. Vascular: No hyperdense vessel or unexpected calcification. Skull: No fracture or focal lesion. Sinuses/Orbits: Paranasal sinuses and mastoid air cells are clear. The visualized orbits are unremarkable. Other: None. CT CERVICAL SPINE FINDINGS Alignment: Reversal of normal lordosis, no traumatic subluxation. Skull base and vertebrae: No acute fracture. Vertebral body heights are maintained. The dens and skull base are intact. Soft tissues and spinal canal: No prevertebral fluid or swelling. No visible canal hematoma. Disc levels: Disc space narrowing and endplate spurring at C3-C4, C4-C5, and C5-C6. Upper chest: Negative. Other: None. IMPRESSION: 1. Unremarkable noncontrast head CT. No evidence of acute traumatic injury. 2. No fracture of the cervical spine. Reversal of normal cervical lordosis can be seen with muscle spasm, positioning, or soft tissue injury. Electronically Signed   By: Rubye OaksMelanie  Ehinger M.D.   On: 04/23/2018 19:04    Procedures Procedures (including critical care time)  Medications Ordered in ED Medications - No data to display   Initial Impression / Assessment and Plan / ED  Course  I have reviewed the triage vital signs and the nursing notes.  Pertinent labs & imaging results that were available during my care of the patient were reviewed by me and considered in my medical decision making (see chart for details).  Imaging ordered.   Reviewed nursing notes and prior charts for additional history.   Acetaminophen po.  Imaging studies reviewed - no acute intracranial or c spine injury.  Recheck, pt comfortable appearing. No distress.   Final Clinical Impressions(s) / ED Diagnoses   Final diagnoses:  None    ED Discharge Orders    None         Cathren LaineSteinl, Lisaann Atha, MD 04/23/18 [phone]

## 2021-04-28 ENCOUNTER — Other Ambulatory Visit: Payer: Self-pay

## 2021-04-28 ENCOUNTER — Emergency Department (HOSPITAL_COMMUNITY)
Admission: EM | Admit: 2021-04-28 | Discharge: 2021-04-29 | Disposition: A | Discharge status: Home / Self Care | Payer: Medicare Other | Attending: Emergency Medicine | Admitting: Emergency Medicine

## 2021-04-28 DIAGNOSIS — Z79899 Other long term (current) drug therapy: Secondary | ICD-10-CM | POA: Diagnosis not present

## 2021-04-28 DIAGNOSIS — R451 Restlessness and agitation: Secondary | ICD-10-CM | POA: Insufficient documentation

## 2021-04-28 DIAGNOSIS — Z046 Encounter for general psychiatric examination, requested by authority: Secondary | ICD-10-CM | POA: Diagnosis present

## 2021-04-28 DIAGNOSIS — R4689 Other symptoms and signs involving appearance and behavior: Secondary | ICD-10-CM

## 2021-04-28 MED ORDER — ZIPRASIDONE MESYLATE 20 MG IM SOLR
20.0000 mg | Freq: Once | INTRAMUSCULAR | Status: AC | PRN
  Administered 2021-04-28: 20 mg via INTRAMUSCULAR
  Filled 2021-04-28: qty 20

## 2021-04-28 MED ORDER — LORAZEPAM 2 MG/ML IJ SOLN
2.0000 mg | Freq: Once | INTRAMUSCULAR | Status: AC
  Administered 2021-04-29: 2 mg via INTRAMUSCULAR
  Filled 2021-04-28: qty 1

## 2021-04-28 NOTE — ED Notes (Signed)
Order gotten for Geodon to be given to the pt, secondary for him not letting us to collect his lab, security called for safety and Geodon IM given to him.

## 2021-04-28 NOTE — Care Management (Signed)
Patient is from [facility] [address] [place] ph. [phone]

## 2021-04-28 NOTE — ED Provider Notes (Signed)
55 year old male  "[name] is a 55 y.o. male\medical history of intellectual disability, narcolepsy, paranoid schizophrenia, presenting to the ED from [facility].  Per documentation brought in with patient, it is is reported that patient is:  "exhibiting threatening behaviors, aggression, destructive assault, paranoia, and psychotic behaviors.  Refusing all psychiatric medications.  Patient is unable to effectively make healthcare and/or financial decisions on her own behalf secondary to significantly impaired judgment and insight.  Recommend inpatient psychiatric assessment for treatment of acute systems at present a danger to self and or others."  Patient states the medications caused him to sleep like he is "in a coma."  He does not like taking them.  He takes too many.  He is having auditory and visual hallucinations which he endorses.  He reports "alien traffickers, human traffickers. UFOs."  He denies SI or HI.  States he is hungry.  Denies medical problems or complaints."  Physical Exam  BP (!) 146/81 (BP Location: Left Arm)   Pulse (!) 107   Temp 98.4 F (36.9 C) (Oral)   Resp 20   SpO2 97%   Physical Exam Vitals and nursing note reviewed.  Constitutional:      Appearance: He is well-developed.     Comments: Disheveled  HENT:     Head: Normocephalic and atraumatic.  Cardiovascular:     Rate and Rhythm: Normal rate.     Pulses: Normal pulses.  Pulmonary:     Effort: Pulmonary effort is normal. No respiratory distress.     Breath sounds: No stridor. No wheezing, rhonchi or rales.  Abdominal:     Tenderness: There is no abdominal tenderness.  Musculoskeletal:     Cervical back: Neck supple.  Neurological:     Mental Status: He is alert and oriented to person, place, and time.     Cranial Nerves: No cranial nerve deficit.  Psychiatric:        Behavior: Behavior normal.     Comments: Tangential speech     ED Course/Procedures   Clinical  Course as of 04/29/21 0502  Tue Apr 28, 2021  1610 Messaged charge to ask for patient to be moved to the back.  [EH]  1820 While I was speaking with a different patient about their care today this patient became loud, he was yelling and interruptive about kidnapping.  He was able to be redirected. [EH]  1827 Patient continues to refuse labs.   [EH]  Wed Apr 29, 2021  1017 Assisted RN at bedside for blood collection. [MM]    Clinical Course User Index [EH] Cristina Gong, PA-C [MM] Barkley Boards, PA-C    Procedures  MDM    55 year old male history of intellectual disability, narcolepsy, paranoid schizophrenia received at signout pending labs and TTS consult.  He was IVC by PA Roxan Hockey.  Please see her note for further work-up and medical decision making.  Vital signs are stable.  Significant delay in labs due to patient cooperation. Labs and imaging of been reviewed and independently interpreted by me.  Mild leukocytosis.  Minimally changed from previous.  Mild AST elevation of unknown significance.  Ethanol level is not elevated.  No metabolic derangements.  Pt medically cleared at this time. Psych hold orders and home med orders placed. TTS consult pending; please see psych team notes for further documentation of care/dispo. Pt stable at time of med clearance.        Crysten Kaman A, PA-C 04/29/21 0503    [place],  Amadeo Garnet, MD 04/29/21 [phone]

## 2021-04-28 NOTE — ED Notes (Signed)
Pt refusing to have vitals done

## 2021-04-28 NOTE — ED Notes (Signed)
Sitter at the bedside.

## 2021-04-28 NOTE — ED Notes (Signed)
Pt uncooperative in triage process. Will not speak to PA, refusing blood work, has put paper scrubs on over his other clothing despite directions to change clothes. [employer] has already left d/t patient's voluntary status.

## 2021-04-28 NOTE — ED Triage Notes (Signed)
Pt arrives from group home where he's been refusing all psych meds and is sent here for further eval. Call group home when workup is complete/if being d/c and they will arrange transport.

## 2021-04-28 NOTE — ED Notes (Signed)
Pt refuses to get IM Ativan, pt is not aggressive or agitated, but still refusing care, pt is not IVC at this time. ED provider notified.

## 2021-04-28 NOTE — ED Provider Notes (Signed)
Emergency Medicine Provider Triage Evaluation Note  [name] , a 55 y.o. male  was evaluated in triage.  Pt refuses to speak with me.  When I ask him questions he clenches and grinds his teeth while looking at me.   He appears to be responding to internal stimuli, laughs randomly.   Review of Systems  Patient refuses to answer questions.   Physical Exam  BP (!) 152/127 (BP Location: Left Arm)   Pulse (!) 111   Resp 15   SpO2 97%  Gen:   Awake, no distress  Seated on floor Resp:  Normal effort  MSK:   Moves extremities without difficulty  Other:  Randomly laughs, appears to be responding to internal stimuli.  Will not speak to me  Medical Decision Making  Medically screening exam initiated at 3:59 PM.  Appropriate orders placed.  [name] was informed that the remainder of the evaluation will be completed by another provider, this initial triage assessment does not replace that evaluation, and the importance of remaining in the ED until their evaluation is complete.  Patient has papers [facility] him from [facility] [place] signed by Russell Kettle Powell-Boone PA on 04/28/21 .    Patient refuses labs up front.  I evaluated patient at about 28 minutes after being checked in and police have left.  Patient is not cooperative with questions.         Russell Gong, PA-C 04/28/21 1607    Russell Barrette, MD 05/05/21 2110

## 2021-04-28 NOTE — ED Notes (Signed)
Pt continue to refuses to get vitals, blood drawn or any assessment on him.

## 2021-04-28 NOTE — ED Notes (Signed)
Multiple attempts made to get pt's blood collected by this RN and Tyron phlebotomist, pt continues to refuse to let us draw his blood. Provider notified.

## 2021-04-28 NOTE — ED Notes (Signed)
Pt not cooperative on arrival to his room, refusing to follow any instructions given to him, refusing to have labs collected or talk to provider.

## 2021-04-28 NOTE — ED Provider Notes (Signed)
[facility] EMERGENCY DEPARTMENT Provider Note   CSN: [id number] Arrival date & time: 04/28/21  1533     History No chief complaint on file.   Nosson Wender is a 55 y.o. male\medical history of intellectual disability, narcolepsy, paranoid schizophrenia, presenting to the ED from [facility].  Per documentation brought in with patient, it is is reported that patient is:  "exhibiting threatening behaviors, aggression, destructive assault, paranoia, and psychotic behaviors.  Refusing all psychiatric medications.  Patient is unable to effectively make healthcare and/or financial decisions on her own behalf secondary to significantly impaired judgment and insight.  Recommend inpatient psychiatric assessment for treatment of acute systems at present a danger to self and or others."  Patient states the medications caused him to sleep like he is "in a coma."  He does not like taking them.  He takes too many.  He is having auditory and visual hallucinations which he endorses.  He reports "alien traffickers, human traffickers. UFOs."  He denies SI or HI.  States he is hungry.  Denies medical problems or complaints.  The history is provided by the patient and medical records.         Past Medical History:  Diagnosis Date  . Constipation   . Intellectual disability   . Major depressive disorder   . Mental and behavioral problems with communication (including speech)   . Narcolepsy   . Psychosis (HCC)   . Schizophrenia (HCC)   . Thyroid disease     Patient Active Problem List   Diagnosis Date Noted  . Hypokalemia 05/20/2017  . Psychosis (HCC)   . Paranoid schizophrenia (HCC) 05/12/2017    No past surgical history on file.     No family history on file.  Social History   Tobacco Use  . Smoking status: Unknown If Ever Smoked    Home Medications Prior to Admission medications   Medication Sig Start Date End Date Taking? Authorizing  Provider  acetaminophen (TYLENOL) 500 MG tablet Take 500 mg by mouth every 8 (eight) hours as needed for mild pain.    [provider]  benztropine (COGENTIN) 0.5 MG tablet Take 1 tablet (0.5 mg total) by mouth daily. 05/24/17   Charm Rings, NP  docusate sodium (COLACE) 100 MG capsule Take 100 mg by mouth 2 (two) times daily.    [provider]  fluticasone (FLONASE) 50 MCG/ACT nasal spray Place 1 spray into both nostrils daily.    [provider]  gemfibrozil (LOPID) 600 MG tablet Take 600 mg by mouth daily.    [provider]  levothyroxine (SYNTHROID, LEVOTHROID) 25 MCG tablet Take 12.5 mcg by mouth daily before breakfast.    [provider]  lurasidone (LATUDA) 80 MG TABS tablet Take 1 tablet (80 mg total) by mouth at bedtime. 05/24/17   Charm Rings, NP  meloxicam (MOBIC) 7.5 MG tablet Take 7.5 mg by mouth daily as needed for pain.     [provider]  metoprolol tartrate (LOPRESSOR) 25 MG tablet Take 25 mg by mouth 2 (two) times daily.    [provider]  Multiple Vitamin (MULTIVITAMIN WITH MINERALS) TABS tablet Take 1 tablet by mouth daily.    [provider]  omeprazole (PRILOSEC) 20 MG capsule Take 20 mg by mouth daily.    [provider]  tamsulosin (FLOMAX) 0.4 MG CAPS capsule Take 0.4 mg by mouth 2 (two) times daily.    [provider]  traMADol Janean Sark)  50 MG tablet Take 50 mg by mouth 2 (two) times daily.    [provider]  traZODone (DESYREL) 50 MG tablet Take 1 tablet (50 mg total) by mouth at bedtime. 05/24/17   Charm Rings, NP    Allergies    Patient has no known allergies.  Review of Systems   Review of Systems  Psychiatric/Behavioral: Positive for behavioral problems and hallucinations. Negative for suicidal ideas.  All other systems reviewed and are negative.   Physical Exam Updated Vital Signs BP 117/67 (BP Location: Right Arm)   Pulse 79   Resp 18   SpO2 100%    Physical Exam Vitals and nursing note reviewed.  Constitutional:      Appearance: He is well-developed.  HENT:     Head: Normocephalic and atraumatic.  Eyes:     Conjunctiva/sclera: Conjunctivae normal.  Pulmonary:     Effort: Pulmonary effort is normal.  Abdominal:     Palpations: Abdomen is soft.  Skin:    General: Skin is warm.  Neurological:     Mental Status: He is alert.  Psychiatric:     Comments: Patient is able to state that he is at [facility]. Mood is labile.  He is difficult to redirect at times.  He is actively responding to internal stimuli.  Voice volume will change from loud when addressing staff and myself to quiet when responding to internal stimuli.  Insight is poor.      ED Results / Procedures / Treatments   Labs (all labs ordered are listed, but only abnormal results are displayed) Labs Reviewed  COMPREHENSIVE METABOLIC PANEL  ETHANOL  SALICYLATE LEVEL  ACETAMINOPHEN LEVEL  CBC  RAPID URINE DRUG SCREEN, HOSP PERFORMED    EKG None  Radiology No results found.  Procedures Procedures   Medications Ordered in ED Medications  LORazepam (ATIVAN) injection 2 mg (2 mg Intramuscular Not Given 04/28/21 2158)  ziprasidone (GEODON) injection 20 mg (20 mg Intramuscular Given 04/28/21 2105)    ED Course  I have reviewed the triage vital signs and the nursing notes.  Pertinent labs & imaging results that were available during my care of the patient were reviewed by me and considered in my medical decision making (see chart for details).  Clinical Course as of 04/28/21 2315  Tue Apr 28, 2021  1610 Messaged charge to ask for patient to be moved to the back.  [EH]  1820 While I was speaking with a different patient about their care today this patient became loud, he was yelling and interruptive about kidnapping.  He was able to be redirected. [EH]  1827 Patient continues to refuse labs.   [EH]    Clinical Course User Index [EH] Norman Clay   MDM Rules/Calculators/A&P                          Patient brought in from adult care facility for refusing to take his medications, with psychosis and disruptive and violent behavior.  Patient is actively hallucinating on examination, as well as endorses hallucinations about aliens, UFOs, Banker traffickers.  He has a labile mood and is at times difficult to redirect.  He does not want blood work drawn, was demanding a cup of ice which was provided to him.  Nursing will attempt to get blood work, IM Geodon is ordered for mood stabilization.  Continues to refuse care and blood work.Marland Kitchen  He is actively psychotic, hallucinating,  talking with himself.  Nursing reports having difficulty redirecting patient.  As he does not show capacity to make his own medical decisions, patient placed under IVC.   Care handed off at shift change to [employer].  Plan to follow blood work for medical clearance and consult TTS. Final Clinical Impression(s) / ED Diagnoses Final diagnoses:  None    Rx / DC Orders ED Discharge Orders    None       Ranette Luckadoo, [place] N, PA-C 04/28/21 2315    Eber Hong, MD 04/29/21 [phone]

## 2021-04-28 NOTE — ED Notes (Signed)
Attempt made to get a sitter for the pt, called staffing and not sitter available at this time. Pt is no cooperative and his refusing to follow any instructions on the ED. Pt requesting food and drink, pt oriented that as soon he let us do his assessment we will give him what he needs, pt continues to refuse any further care, PA notified.

## 2021-04-29 DIAGNOSIS — R451 Restlessness and agitation: Secondary | ICD-10-CM | POA: Diagnosis not present

## 2021-04-29 LAB — CBC
HCT: 48.2 % (ref 39.0–52.0)
Hemoglobin: 15.6 g/dL (ref 13.0–17.0)
MCH: 30 pg (ref 26.0–34.0)
MCHC: 32.4 g/dL (ref 30.0–36.0)
MCV: 92.7 fL (ref 80.0–100.0)
Platelets: 264 10*3/uL (ref 150–400)
RBC: 5.2 MIL/uL (ref 4.22–5.81)
RDW: 13.7 % (ref 11.5–15.5)
WBC: 13 10*3/uL — ABNORMAL HIGH (ref 4.0–10.5)
nRBC: 0 % (ref 0.0–0.2)

## 2021-04-29 LAB — COMPREHENSIVE METABOLIC PANEL
ALT: 38 U/L (ref 0–44)
AST: 66 U/L — ABNORMAL HIGH (ref 15–41)
Albumin: 3.8 g/dL (ref 3.5–5.0)
Alkaline Phosphatase: 56 U/L (ref 38–126)
Anion gap: 10 (ref 5–15)
BUN: 13 mg/dL (ref 6–20)
CO2: 23 mmol/L (ref 22–32)
Calcium: 9 mg/dL (ref 8.9–10.3)
Chloride: 104 mmol/L (ref 98–111)
Creatinine, Ser: 1.28 mg/dL — ABNORMAL HIGH (ref 0.61–1.24)
GFR, Estimated: >60 mL/min (ref >60)
Glucose, Bld: 93 mg/dL (ref 70–99)
Potassium: 3.8 mmol/L (ref 3.5–5.1)
Sodium: 137 mmol/L (ref 135–145)
Total Bilirubin: 0.8 mg/dL (ref 0.3–1.2)
Total Protein: 7 g/dL (ref 6.5–8.1)

## 2021-04-29 LAB — ETHANOL: Alcohol, Ethyl (B): <10 mg/dL (ref <10)

## 2021-04-29 LAB — SALICYLATE LEVEL: Salicylate Lvl: <7 mg/dL — ABNORMAL LOW (ref 7.0–30.0)

## 2021-04-29 LAB — ACETAMINOPHEN LEVEL: Acetaminophen (Tylenol), Serum: <10 ug/mL — ABNORMAL LOW (ref 10–30)

## 2021-04-29 MED ORDER — VENLAFAXINE HCL ER 75 MG PO CP24
75.0000 mg | ORAL_CAPSULE | Freq: Every day | ORAL | Status: DC
  Administered 2021-04-29: 75 mg via ORAL
  Filled 2021-04-29: qty 1

## 2021-04-29 MED ORDER — TRAZODONE HCL 50 MG PO TABS: 50.0000 mg | ORAL_TABLET | Freq: Every day | ORAL | Status: DC

## 2021-04-29 MED ORDER — METOPROLOL SUCCINATE ER 25 MG PO TB24
25.0000 mg | ORAL_TABLET | Freq: Every day | ORAL | Status: DC
  Filled 2021-04-29: qty 1

## 2021-04-29 MED ORDER — ONDANSETRON HCL 4 MG PO TABS: 4.0000 mg | ORAL_TABLET | Freq: Three times a day (TID) | ORAL | Status: DC | PRN

## 2021-04-29 MED ORDER — BENZTROPINE MESYLATE 1 MG PO TABS
0.5000 mg | ORAL_TABLET | Freq: Every day | ORAL | Status: DC
  Administered 2021-04-29: 0.5 mg via ORAL
  Filled 2021-04-29: qty 1

## 2021-04-29 MED ORDER — PRAZOSIN HCL 2 MG PO CAPS: 2.0000 mg | ORAL_CAPSULE | Freq: Every day | ORAL | Status: DC

## 2021-04-29 MED ORDER — ALUM & MAG HYDROXIDE-SIMETH 200-200-20 MG/5ML PO SUSP: 30.0000 mL | Freq: Four times a day (QID) | ORAL | Status: DC | PRN

## 2021-04-29 MED ORDER — CHLORPROMAZINE HCL 25 MG PO TABS
100.0000 mg | ORAL_TABLET | Freq: Three times a day (TID) | ORAL | Status: DC
  Administered 2021-04-29: 100 mg via ORAL
  Filled 2021-04-29: qty 4

## 2021-04-29 MED ORDER — LEVOTHYROXINE SODIUM 25 MCG PO TABS: 12.5000 ug | ORAL_TABLET | Freq: Every day | ORAL | Status: DC

## 2021-04-29 MED ORDER — FLUPHENAZINE HCL 5 MG PO TABS
10.0000 mg | ORAL_TABLET | Freq: Every day | ORAL | Status: DC
  Administered 2021-04-29: 10 mg via ORAL
  Filled 2021-04-29: qty 2

## 2021-04-29 MED ORDER — IBUPROFEN 400 MG PO TABS: 600.0000 mg | ORAL_TABLET | Freq: Three times a day (TID) | ORAL | Status: DC | PRN

## 2021-04-29 MED ORDER — LURASIDONE HCL 40 MG PO TABS: 80.0000 mg | ORAL_TABLET | Freq: Every day | ORAL | Status: DC

## 2021-04-29 NOTE — ED Notes (Signed)
IVC paperwork completed. Original in [place]

## 2021-04-29 NOTE — ED Notes (Signed)
Dinner ordered 

## 2021-04-29 NOTE — ED Notes (Signed)
Pt lives at [place] . Pt has been DC home.

## 2021-04-29 NOTE — ED Notes (Signed)
Pt walked to staff desk and wanted to call 911 to talk to [employer] . Pt informed unsure how to contact Officer Carlynn Purl. Calling 911 was not the way to get in touch the Officer.
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
## 2021-04-29 NOTE — Discharge Instructions (Addendum)
It was our pleasure to provide your ER care today - we hope that you feel better.  Our psychiatry team has assessed and indicates currently no indication for inpatient psychiatric admission.    Continue your medication, and follow up closely with your primary care doctor, and behavioral health specialist, as an outpatient in the next 1-2 weeks.   Return to ER if worse, new symptoms, fevers, new/severe pain, trouble breathing, or other emergency concern.

## 2021-04-29 NOTE — ED Notes (Signed)
Patient eating dinner.

## 2021-04-29 NOTE — Progress Notes (Signed)
CSW received consult to see if patient had a legal guardian. CSW contacted Shari Prows of court to confirm and no guardian is listed.     CSW also contacted [facility] and they have no record of patient having a legal guardian and patient has been at the facility since 2007. CSW asked if patient was able to return after patient is evaluated. Facility wanted to know if medications have been adjusted. CSW stated patient is awaiting to see the doctor.

## 2021-04-29 NOTE — ED Notes (Signed)
Patient refusing to allow writer to draw his blood stating, "Let me see your arm so I can draw your blood.  Which arm do you want me to draw it from?"

## 2021-04-29 NOTE — ED Notes (Signed)
Patient refusing lab draw stating, "I'm not ready yet.  I'll do it later."  Writer inquired about waiting 10 minutes.  Patient stated, "It's going to be longer than that.  Let me draw your blood.  Give me your arm."

## 2021-04-29 NOTE — ED Provider Notes (Addendum)
Emergency Medicine Observation Re-evaluation Note  [name] is a 55 y.o. male, seen on rounds today.  Pt initially presented to the ED for complaints of patient from his group home with behavioral symptoms. Hx same. Currently calm, alert, nad, with no new c/o this AM.   Physical Exam  BP (!) 146/81 (BP Location: Left Arm)   Pulse (!) 107   Temp 98.4 F (36.9 C) (Oral)   Resp 20   SpO2 97%  Physical Exam General: calm, alert. Cardiac: regular rate Lungs: breathing comfortably Psych: alert, normal mood/affect. No SI/HI.   ED Course / MDM   I have reviewed the labs performed to date as well as medications administered while in observation.  Recent changes in the last 24 hours include stabilization in ED and BH reassessment.   Plan  Current plan is for [facility] team reassessment this AM, with hopeful transition back to his group home with outpatient [facility] f/u plan.      Cathren Laine, MD 04/29/21 [phone]   [facility] team has reassessed and indicates pt is psych cleared for d/c.  Pt is alert, cooperative, conversant, and indicates he feels ready to return to his home/facility.   Pt currently appears stable for d/c.      Cathren Laine, MD 04/29/21 [phone]

## 2021-04-29 NOTE — BH Assessment (Signed)
DISPOSITION: Gave clinical report to Assunta Found , NP who determined Pt does not  meet criteria for inpatient psychiatric treatment. Notified Dr. Cathren Laine, MD and Desmond Lope ,RN of disposition recommendation and the sitter utilization recommendation.

## 2021-04-29 NOTE — ED Notes (Signed)
Pt does not interact with staff.

## 2021-04-29 NOTE — ED Notes (Signed)
Breakfast order placed ?

## 2021-04-29 NOTE — Progress Notes (Addendum)
CSW attempted to reach [facility] to inform staff that Pt will be returning but was unable to reach anyone @ [phone]  CSW provided taxi voucher for Pt discharge.

## 2022-02-23 ENCOUNTER — Encounter: Payer: Self-pay | Admitting: Radiology

## 2022-03-30 ENCOUNTER — Telehealth: Payer: Self-pay | Admitting: Radiology

## 2022-03-30 NOTE — Telephone Encounter (Signed)
Lynden Ang called and asked about patient being scheduled for appt.  The referral sent a few months ago- we never reached patient to schedule.  Lynden Ang will have the admin of the [facility] in [place] to call me to discuss sched an appt.  ?

## 2022-04-14 ENCOUNTER — Encounter: Payer: Self-pay | Admitting: Orthopedic Surgery

## 2022-04-14 ENCOUNTER — Ambulatory Visit (INDEPENDENT_AMBULATORY_CARE_PROVIDER_SITE_OTHER): Payer: Medicare Other

## 2022-04-14 ENCOUNTER — Ambulatory Visit (INDEPENDENT_AMBULATORY_CARE_PROVIDER_SITE_OTHER): Payer: Medicare Other | Admitting: Orthopedic Surgery

## 2022-04-14 VITALS — BP 132/92 | HR 106 | Ht 70.0 in | Wt 243.0 lb

## 2022-04-14 DIAGNOSIS — M5442 Lumbago with sciatica, left side: Secondary | ICD-10-CM | POA: Diagnosis not present

## 2022-04-14 DIAGNOSIS — G8929 Other chronic pain: Secondary | ICD-10-CM | POA: Diagnosis not present

## 2022-04-14 DIAGNOSIS — M5441 Lumbago with sciatica, right side: Secondary | ICD-10-CM

## 2022-04-14 MED ORDER — TRAMADOL HCL 50 MG PO TABS: 50.0000 mg | ORAL_TABLET | Freq: Two times a day (BID) | ORAL | 0 refills | Status: AC | PRN

## 2022-04-14 NOTE — Progress Notes (Signed)
New Patient Visit  Assessment: [name] is a 56 y.o. male with the following: 1. Chronic bilateral low back pain with bilateral sciatica  Plan: Lumumba Loertscher has back pain, with evidence of lumbar spine arthritis, without anterolisthesis.  Patient has some mental health issues, so it is difficult to effectively communicate with him.  It is unclear if he is having radiating pains into his legs, or any associated numbness or tingling.  Nonetheless, we will plan to treat his low back pain with tramadol.  He does have a history of kidney disease, so NSAIDs cannot be used.  No signs or symptoms of cauda equina, as emergency surgery would be the only type of surgery under consideration.  Follow-up as needed.  Follow-up: Return if symptoms worsen or fail to improve.  Subjective:  Chief Complaint  Patient presents with   Back Pain    Pain getting worse, has numbness going down both legs    History of Present Illness: [name] is a 56 y.o. male who presents for evaluation of low back pain.  It has been progressively worsening.  He is just recently been transferred to a new behavioral health facility.  It is difficult to communicate with the patient.  He states he has some pain radiating distally.  No specific injury.  He cannot take NSAIDs due to kidney disease.   Review of Systems: Unable to assess   Medical History:  Past Medical History:  Diagnosis Date   Constipation    Intellectual disability    Major depressive disorder    Mental and behavioral problems with communication (including speech)    Narcolepsy    Psychosis ([place])    Schizophrenia ([place])    Thyroid disease     No past surgical history on file.  No family history on file. Social History   Tobacco Use   Smoking status: Unknown    No Known Allergies  Current Meds  Medication Sig   acetaminophen (TYLENOL) 650 MG suppository Place 650 mg rectally every 4 (four) hours as needed.   benztropine (COGENTIN)  0.5 MG tablet Take 1 tablet (0.5 mg total) by mouth daily.   calcium carbonate (TUMS - DOSED IN MG ELEMENTAL CALCIUM) 500 MG chewable tablet Chew 1,000 mg by mouth 3 (three) times daily as needed for indigestion.   chlorproMAZINE (THORAZINE) 200 MG tablet Take 200 mg by mouth.   ciclopirox (PENLAC) 8 % solution Apply 1 application topically daily.   clonazePAM (KLONOPIN) 0.5 MG tablet Take 0.5 mg by mouth daily.   clonazePAM (KLONOPIN) 1 MG tablet Take 1 mg by mouth daily.   clonazePAM (KLONOPIN) 1 MG tablet Take 1 mg by mouth daily. Max daily 2 MG   cyclobenzaprine (FLEXERIL) 5 MG tablet Take 5 mg by mouth 3 (three) times daily as needed.   divalproex (DEPAKOTE) 500 MG DR tablet Take 500 mg by mouth 2 (two) times daily.   docusate sodium (COLACE) 100 MG capsule Take 100 mg by mouth 2 (two) times daily.   famotidine (PEPCID) 20 MG tablet Take 20 mg by mouth daily.   fenofibrate (TRICOR) 48 MG tablet Take 48 mg by mouth daily.   fenofibrate 54 MG tablet Take 54 mg by mouth daily.   fluPHENAZine (PROLIXIN) 10 MG tablet Take 10 mg by mouth daily.   fluticasone (FLONASE) 50 MCG/ACT nasal spray Place 1 spray into both nostrils daily.   gemfibrozil (LOPID) 600 MG tablet Take 600 mg by mouth daily.   levothyroxine (SYNTHROID) 25 MCG  tablet Take by mouth.   levothyroxine (SYNTHROID, LEVOTHROID) 25 MCG tablet Take 12.5 mcg by mouth daily before breakfast.   lurasidone (LATUDA) 80 MG TABS tablet Take 1 tablet (80 mg total) by mouth at bedtime. (Patient taking differently: Take 80 mg by mouth in the morning and at bedtime.)   meloxicam (MOBIC) 7.5 MG tablet Take 7.5 mg by mouth daily as needed for pain.    metoprolol succinate (TOPROL-XL) 25 MG 24 hr tablet Take 25 mg by mouth daily.   metoprolol tartrate (LOPRESSOR) 25 MG tablet Take 25 mg by mouth 2 (two) times daily.   Multiple Vitamin (MULTIVITAMIN WITH MINERALS) TABS tablet Take 1 tablet by mouth daily.   omeprazole (PRILOSEC OTC) 20 MG tablet Take  20 mg by mouth daily.   paliperidone (INVEGA) 9 MG 24 hr tablet Take 9 mg by mouth daily.   polyethylene glycol powder (GLYCOLAX/MIRALAX) 17 GM/SCOOP powder Take 17 g by mouth daily.   prazosin (MINIPRESS) 2 MG capsule Take 2 mg by mouth at bedtime.   tamsulosin (FLOMAX) 0.4 MG CAPS capsule Take 0.4 mg by mouth 2 (two) times daily.   traMADol (ULTRAM) 50 MG tablet Take 1 tablet (50 mg total) by mouth every 12 (twelve) hours as needed.   traZODone (DESYREL) 50 MG tablet Take 1 tablet (50 mg total) by mouth at bedtime.   venlafaxine XR (EFFEXOR-XR) 37.5 MG 24 hr capsule Take 37.5 mg by mouth daily.   [DISCONTINUED] traMADol (ULTRAM) 50 MG tablet Take 50 mg by mouth 2 (two) times daily.   [DISCONTINUED] venlafaxine XR (EFFEXOR-XR) 150 MG 24 hr capsule Take 37.5 mg by mouth daily.    Objective: BP (!) 132/92   Pulse (!) 106   Ht 5\' 10"  (1.778 m)   Wt 243 lb (110.2 kg)   BMI 34.87 kg/m   Physical Exam:  General: No acute distress. Gait: Slow, waddling gait.  Tenderness to palpation over the lower back.  Pretty good strength bilateral lower extremities.  He does respond to light touch in bilateral lower extremities.  1+ patellar tendon reflexes  IMAGING: I personally ordered and reviewed the following images  Standing lumbar spine x-rays demonstrates no acute injury.  Loss of disc height  within the lumbar spine.  Severe at L3-4, L4-5, and L5-S1.  Large anterior osteophytes at L4-5.  No anterolisthesis.  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 New Medications:  Meds ordered this encounter  Medications   traMADol (ULTRAM) 50 MG tablet    Sig: Take 1 tablet (50 mg total) by mouth every 12 (twelve) hours as needed.    Dispense:  30 tablet    Refill:  0      Russell Rasmussen, MD  04/14/2022 11:01 PM

## 2023-01-13 ENCOUNTER — Telehealth: Payer: Self-pay | Admitting: Orthopedic Surgery

## 2023-01-13 NOTE — Telephone Encounter (Signed)
[facility] w[facility] called, lvm stating that this patient is a resident at [facility] and they are requesting a refill on Tramadol 20m, 30 quantity, 1 every 12 hours.  Phone [phone] if d/c, fax d/c order to [phone]

## 2023-01-19 NOTE — Telephone Encounter (Signed)
Called and gave verbal order to D/C medication.
